# Patient Record
Sex: Female | Born: 1935 | Race: White | Hispanic: No | Smoking: Never smoker
Health system: Southern US, Community
[De-identification: ages and names within clinical notes are randomized; demographics above are authoritative.]

## PROBLEM LIST (undated history)

## (undated) DIAGNOSIS — H269 Unspecified cataract: Secondary | ICD-10-CM

## (undated) DIAGNOSIS — D649 Anemia, unspecified: Secondary | ICD-10-CM

## (undated) DIAGNOSIS — T7840XA Allergy, unspecified, initial encounter: Secondary | ICD-10-CM

## (undated) DIAGNOSIS — M1990 Unspecified osteoarthritis, unspecified site: Secondary | ICD-10-CM

## (undated) DIAGNOSIS — I10 Essential (primary) hypertension: Secondary | ICD-10-CM

## (undated) DIAGNOSIS — R011 Cardiac murmur, unspecified: Secondary | ICD-10-CM

## (undated) DIAGNOSIS — C4490 Unspecified malignant neoplasm of skin, unspecified: Secondary | ICD-10-CM

## (undated) DIAGNOSIS — C801 Malignant (primary) neoplasm, unspecified: Secondary | ICD-10-CM

## (undated) DIAGNOSIS — K219 Gastro-esophageal reflux disease without esophagitis: Secondary | ICD-10-CM

## (undated) HISTORY — PX: EYE SURGERY: SHX253

## (undated) HISTORY — PX: BELPHAROPTOSIS REPAIR: SHX369

## (undated) HISTORY — PX: TONSILLECTOMY: SUR1361

## (undated) HISTORY — DX: Cardiac murmur, unspecified: R01.1

---

## 1999-05-22 ENCOUNTER — Ambulatory Visit (HOSPITAL_COMMUNITY): Admission: RE | Admit: 1999-05-22 | Discharge: 1999-05-22 | Payer: Self-pay | Admitting: Endocrinology

## 2007-10-02 ENCOUNTER — Ambulatory Visit: Payer: Self-pay | Admitting: Gastroenterology

## 2007-10-16 ENCOUNTER — Ambulatory Visit: Payer: Self-pay | Admitting: Gastroenterology

## 2010-10-01 ENCOUNTER — Encounter
Admission: RE | Admit: 2010-10-01 | Discharge: 2010-10-01 | Payer: Self-pay | Source: Home / Self Care | Attending: Endocrinology | Admitting: Endocrinology

## 2011-11-09 DIAGNOSIS — R7301 Impaired fasting glucose: Secondary | ICD-10-CM | POA: Diagnosis not present

## 2011-11-09 DIAGNOSIS — E785 Hyperlipidemia, unspecified: Secondary | ICD-10-CM | POA: Diagnosis not present

## 2011-11-09 DIAGNOSIS — M810 Age-related osteoporosis without current pathological fracture: Secondary | ICD-10-CM | POA: Diagnosis not present

## 2011-11-09 DIAGNOSIS — I10 Essential (primary) hypertension: Secondary | ICD-10-CM | POA: Diagnosis not present

## 2011-11-11 DIAGNOSIS — R7301 Impaired fasting glucose: Secondary | ICD-10-CM | POA: Diagnosis not present

## 2011-11-11 DIAGNOSIS — K589 Irritable bowel syndrome without diarrhea: Secondary | ICD-10-CM | POA: Diagnosis not present

## 2011-11-11 DIAGNOSIS — I10 Essential (primary) hypertension: Secondary | ICD-10-CM | POA: Diagnosis not present

## 2011-11-11 DIAGNOSIS — M810 Age-related osteoporosis without current pathological fracture: Secondary | ICD-10-CM | POA: Diagnosis not present

## 2011-11-11 DIAGNOSIS — E785 Hyperlipidemia, unspecified: Secondary | ICD-10-CM | POA: Diagnosis not present

## 2012-01-07 DIAGNOSIS — Z1231 Encounter for screening mammogram for malignant neoplasm of breast: Secondary | ICD-10-CM | POA: Diagnosis not present

## 2012-03-21 DIAGNOSIS — E785 Hyperlipidemia, unspecified: Secondary | ICD-10-CM | POA: Diagnosis not present

## 2012-03-21 DIAGNOSIS — I10 Essential (primary) hypertension: Secondary | ICD-10-CM | POA: Diagnosis not present

## 2012-03-21 DIAGNOSIS — R7301 Impaired fasting glucose: Secondary | ICD-10-CM | POA: Diagnosis not present

## 2012-03-23 DIAGNOSIS — I10 Essential (primary) hypertension: Secondary | ICD-10-CM | POA: Diagnosis not present

## 2012-03-23 DIAGNOSIS — K589 Irritable bowel syndrome without diarrhea: Secondary | ICD-10-CM | POA: Diagnosis not present

## 2012-03-23 DIAGNOSIS — E785 Hyperlipidemia, unspecified: Secondary | ICD-10-CM | POA: Diagnosis not present

## 2012-03-23 DIAGNOSIS — R7301 Impaired fasting glucose: Secondary | ICD-10-CM | POA: Diagnosis not present

## 2012-03-23 DIAGNOSIS — M810 Age-related osteoporosis without current pathological fracture: Secondary | ICD-10-CM | POA: Diagnosis not present

## 2012-07-24 DIAGNOSIS — R7301 Impaired fasting glucose: Secondary | ICD-10-CM | POA: Diagnosis not present

## 2012-07-24 DIAGNOSIS — I10 Essential (primary) hypertension: Secondary | ICD-10-CM | POA: Diagnosis not present

## 2012-07-24 DIAGNOSIS — M810 Age-related osteoporosis without current pathological fracture: Secondary | ICD-10-CM | POA: Diagnosis not present

## 2012-07-24 DIAGNOSIS — E785 Hyperlipidemia, unspecified: Secondary | ICD-10-CM | POA: Diagnosis not present

## 2012-07-26 DIAGNOSIS — E785 Hyperlipidemia, unspecified: Secondary | ICD-10-CM | POA: Diagnosis not present

## 2012-07-26 DIAGNOSIS — Z23 Encounter for immunization: Secondary | ICD-10-CM | POA: Diagnosis not present

## 2012-07-26 DIAGNOSIS — M810 Age-related osteoporosis without current pathological fracture: Secondary | ICD-10-CM | POA: Diagnosis not present

## 2012-07-26 DIAGNOSIS — K589 Irritable bowel syndrome without diarrhea: Secondary | ICD-10-CM | POA: Diagnosis not present

## 2012-07-26 DIAGNOSIS — I10 Essential (primary) hypertension: Secondary | ICD-10-CM | POA: Diagnosis not present

## 2012-07-26 DIAGNOSIS — R7301 Impaired fasting glucose: Secondary | ICD-10-CM | POA: Diagnosis not present

## 2012-09-08 DIAGNOSIS — L819 Disorder of pigmentation, unspecified: Secondary | ICD-10-CM | POA: Diagnosis not present

## 2012-09-08 DIAGNOSIS — D1801 Hemangioma of skin and subcutaneous tissue: Secondary | ICD-10-CM | POA: Diagnosis not present

## 2012-09-08 DIAGNOSIS — L723 Sebaceous cyst: Secondary | ICD-10-CM | POA: Diagnosis not present

## 2012-09-08 DIAGNOSIS — B351 Tinea unguium: Secondary | ICD-10-CM | POA: Diagnosis not present

## 2012-09-08 DIAGNOSIS — L821 Other seborrheic keratosis: Secondary | ICD-10-CM | POA: Diagnosis not present

## 2012-09-08 DIAGNOSIS — Z85828 Personal history of other malignant neoplasm of skin: Secondary | ICD-10-CM | POA: Diagnosis not present

## 2012-09-28 DIAGNOSIS — H31009 Unspecified chorioretinal scars, unspecified eye: Secondary | ICD-10-CM | POA: Diagnosis not present

## 2012-09-28 DIAGNOSIS — H26499 Other secondary cataract, unspecified eye: Secondary | ICD-10-CM | POA: Diagnosis not present

## 2012-09-28 DIAGNOSIS — Z961 Presence of intraocular lens: Secondary | ICD-10-CM | POA: Diagnosis not present

## 2012-11-27 DIAGNOSIS — I10 Essential (primary) hypertension: Secondary | ICD-10-CM | POA: Diagnosis not present

## 2012-11-27 DIAGNOSIS — R7301 Impaired fasting glucose: Secondary | ICD-10-CM | POA: Diagnosis not present

## 2012-11-27 DIAGNOSIS — E785 Hyperlipidemia, unspecified: Secondary | ICD-10-CM | POA: Diagnosis not present

## 2012-11-27 DIAGNOSIS — M810 Age-related osteoporosis without current pathological fracture: Secondary | ICD-10-CM | POA: Diagnosis not present

## 2012-11-29 DIAGNOSIS — E785 Hyperlipidemia, unspecified: Secondary | ICD-10-CM | POA: Diagnosis not present

## 2012-11-29 DIAGNOSIS — K589 Irritable bowel syndrome without diarrhea: Secondary | ICD-10-CM | POA: Diagnosis not present

## 2012-11-29 DIAGNOSIS — I10 Essential (primary) hypertension: Secondary | ICD-10-CM | POA: Diagnosis not present

## 2012-11-29 DIAGNOSIS — M810 Age-related osteoporosis without current pathological fracture: Secondary | ICD-10-CM | POA: Diagnosis not present

## 2012-11-29 DIAGNOSIS — R7301 Impaired fasting glucose: Secondary | ICD-10-CM | POA: Diagnosis not present

## 2013-01-30 DIAGNOSIS — Z1231 Encounter for screening mammogram for malignant neoplasm of breast: Secondary | ICD-10-CM | POA: Diagnosis not present

## 2013-03-29 DIAGNOSIS — E785 Hyperlipidemia, unspecified: Secondary | ICD-10-CM | POA: Diagnosis not present

## 2013-03-29 DIAGNOSIS — R7301 Impaired fasting glucose: Secondary | ICD-10-CM | POA: Diagnosis not present

## 2013-03-29 DIAGNOSIS — I10 Essential (primary) hypertension: Secondary | ICD-10-CM | POA: Diagnosis not present

## 2013-04-03 DIAGNOSIS — E785 Hyperlipidemia, unspecified: Secondary | ICD-10-CM | POA: Diagnosis not present

## 2013-04-03 DIAGNOSIS — I10 Essential (primary) hypertension: Secondary | ICD-10-CM | POA: Diagnosis not present

## 2013-04-03 DIAGNOSIS — M810 Age-related osteoporosis without current pathological fracture: Secondary | ICD-10-CM | POA: Diagnosis not present

## 2013-04-03 DIAGNOSIS — K589 Irritable bowel syndrome without diarrhea: Secondary | ICD-10-CM | POA: Diagnosis not present

## 2013-04-03 DIAGNOSIS — R7301 Impaired fasting glucose: Secondary | ICD-10-CM | POA: Diagnosis not present

## 2013-08-06 DIAGNOSIS — E785 Hyperlipidemia, unspecified: Secondary | ICD-10-CM | POA: Diagnosis not present

## 2013-08-06 DIAGNOSIS — I10 Essential (primary) hypertension: Secondary | ICD-10-CM | POA: Diagnosis not present

## 2013-08-06 DIAGNOSIS — M810 Age-related osteoporosis without current pathological fracture: Secondary | ICD-10-CM | POA: Diagnosis not present

## 2013-08-06 DIAGNOSIS — R7301 Impaired fasting glucose: Secondary | ICD-10-CM | POA: Diagnosis not present

## 2013-08-09 DIAGNOSIS — E785 Hyperlipidemia, unspecified: Secondary | ICD-10-CM | POA: Diagnosis not present

## 2013-08-09 DIAGNOSIS — R7301 Impaired fasting glucose: Secondary | ICD-10-CM | POA: Diagnosis not present

## 2013-08-09 DIAGNOSIS — M810 Age-related osteoporosis without current pathological fracture: Secondary | ICD-10-CM | POA: Diagnosis not present

## 2013-08-09 DIAGNOSIS — Z23 Encounter for immunization: Secondary | ICD-10-CM | POA: Diagnosis not present

## 2013-08-09 DIAGNOSIS — I10 Essential (primary) hypertension: Secondary | ICD-10-CM | POA: Diagnosis not present

## 2013-08-09 DIAGNOSIS — K589 Irritable bowel syndrome without diarrhea: Secondary | ICD-10-CM | POA: Diagnosis not present

## 2013-12-07 DIAGNOSIS — L678 Other hair color and hair shaft abnormalities: Secondary | ICD-10-CM | POA: Diagnosis not present

## 2013-12-07 DIAGNOSIS — L738 Other specified follicular disorders: Secondary | ICD-10-CM | POA: Diagnosis not present

## 2013-12-07 DIAGNOSIS — D1801 Hemangioma of skin and subcutaneous tissue: Secondary | ICD-10-CM | POA: Diagnosis not present

## 2013-12-07 DIAGNOSIS — L821 Other seborrheic keratosis: Secondary | ICD-10-CM | POA: Diagnosis not present

## 2013-12-11 DIAGNOSIS — I10 Essential (primary) hypertension: Secondary | ICD-10-CM | POA: Diagnosis not present

## 2013-12-11 DIAGNOSIS — E785 Hyperlipidemia, unspecified: Secondary | ICD-10-CM | POA: Diagnosis not present

## 2013-12-11 DIAGNOSIS — R7301 Impaired fasting glucose: Secondary | ICD-10-CM | POA: Diagnosis not present

## 2013-12-13 DIAGNOSIS — E785 Hyperlipidemia, unspecified: Secondary | ICD-10-CM | POA: Diagnosis not present

## 2013-12-13 DIAGNOSIS — M810 Age-related osteoporosis without current pathological fracture: Secondary | ICD-10-CM | POA: Diagnosis not present

## 2013-12-13 DIAGNOSIS — I10 Essential (primary) hypertension: Secondary | ICD-10-CM | POA: Diagnosis not present

## 2013-12-13 DIAGNOSIS — K589 Irritable bowel syndrome without diarrhea: Secondary | ICD-10-CM | POA: Diagnosis not present

## 2013-12-13 DIAGNOSIS — R7301 Impaired fasting glucose: Secondary | ICD-10-CM | POA: Diagnosis not present

## 2014-05-03 DIAGNOSIS — Z1231 Encounter for screening mammogram for malignant neoplasm of breast: Secondary | ICD-10-CM | POA: Diagnosis not present

## 2014-05-03 DIAGNOSIS — Z803 Family history of malignant neoplasm of breast: Secondary | ICD-10-CM | POA: Diagnosis not present

## 2014-05-13 DIAGNOSIS — I10 Essential (primary) hypertension: Secondary | ICD-10-CM | POA: Diagnosis not present

## 2014-05-13 DIAGNOSIS — R7301 Impaired fasting glucose: Secondary | ICD-10-CM | POA: Diagnosis not present

## 2014-05-13 DIAGNOSIS — E785 Hyperlipidemia, unspecified: Secondary | ICD-10-CM | POA: Diagnosis not present

## 2014-05-15 DIAGNOSIS — K589 Irritable bowel syndrome without diarrhea: Secondary | ICD-10-CM | POA: Diagnosis not present

## 2014-05-15 DIAGNOSIS — R7301 Impaired fasting glucose: Secondary | ICD-10-CM | POA: Diagnosis not present

## 2014-05-15 DIAGNOSIS — M810 Age-related osteoporosis without current pathological fracture: Secondary | ICD-10-CM | POA: Diagnosis not present

## 2014-05-15 DIAGNOSIS — I10 Essential (primary) hypertension: Secondary | ICD-10-CM | POA: Diagnosis not present

## 2014-05-15 DIAGNOSIS — E785 Hyperlipidemia, unspecified: Secondary | ICD-10-CM | POA: Diagnosis not present

## 2014-06-10 ENCOUNTER — Encounter: Payer: Self-pay | Admitting: Gastroenterology

## 2014-07-02 DIAGNOSIS — Z23 Encounter for immunization: Secondary | ICD-10-CM | POA: Diagnosis not present

## 2014-10-02 DIAGNOSIS — Z85828 Personal history of other malignant neoplasm of skin: Secondary | ICD-10-CM | POA: Diagnosis not present

## 2014-10-02 DIAGNOSIS — L821 Other seborrheic keratosis: Secondary | ICD-10-CM | POA: Diagnosis not present

## 2014-10-02 DIAGNOSIS — L570 Actinic keratosis: Secondary | ICD-10-CM | POA: Diagnosis not present

## 2014-10-02 DIAGNOSIS — D2271 Melanocytic nevi of right lower limb, including hip: Secondary | ICD-10-CM | POA: Diagnosis not present

## 2014-10-02 DIAGNOSIS — L814 Other melanin hyperpigmentation: Secondary | ICD-10-CM | POA: Diagnosis not present

## 2014-10-08 DIAGNOSIS — R7301 Impaired fasting glucose: Secondary | ICD-10-CM | POA: Diagnosis not present

## 2014-10-08 DIAGNOSIS — E782 Mixed hyperlipidemia: Secondary | ICD-10-CM | POA: Diagnosis not present

## 2014-10-08 DIAGNOSIS — K589 Irritable bowel syndrome without diarrhea: Secondary | ICD-10-CM | POA: Diagnosis not present

## 2014-10-08 DIAGNOSIS — M810 Age-related osteoporosis without current pathological fracture: Secondary | ICD-10-CM | POA: Diagnosis not present

## 2014-10-08 DIAGNOSIS — I10 Essential (primary) hypertension: Secondary | ICD-10-CM | POA: Diagnosis not present

## 2014-11-05 DIAGNOSIS — H43393 Other vitreous opacities, bilateral: Secondary | ICD-10-CM | POA: Diagnosis not present

## 2014-11-05 DIAGNOSIS — Z961 Presence of intraocular lens: Secondary | ICD-10-CM | POA: Diagnosis not present

## 2014-11-05 DIAGNOSIS — H26491 Other secondary cataract, right eye: Secondary | ICD-10-CM | POA: Diagnosis not present

## 2015-02-04 DIAGNOSIS — I10 Essential (primary) hypertension: Secondary | ICD-10-CM | POA: Diagnosis not present

## 2015-02-04 DIAGNOSIS — R7301 Impaired fasting glucose: Secondary | ICD-10-CM | POA: Diagnosis not present

## 2015-02-04 DIAGNOSIS — E782 Mixed hyperlipidemia: Secondary | ICD-10-CM | POA: Diagnosis not present

## 2015-02-06 DIAGNOSIS — I10 Essential (primary) hypertension: Secondary | ICD-10-CM | POA: Diagnosis not present

## 2015-02-06 DIAGNOSIS — M810 Age-related osteoporosis without current pathological fracture: Secondary | ICD-10-CM | POA: Diagnosis not present

## 2015-02-06 DIAGNOSIS — E782 Mixed hyperlipidemia: Secondary | ICD-10-CM | POA: Diagnosis not present

## 2015-02-06 DIAGNOSIS — K589 Irritable bowel syndrome without diarrhea: Secondary | ICD-10-CM | POA: Diagnosis not present

## 2015-02-06 DIAGNOSIS — R7301 Impaired fasting glucose: Secondary | ICD-10-CM | POA: Diagnosis not present

## 2015-06-04 DIAGNOSIS — R7301 Impaired fasting glucose: Secondary | ICD-10-CM | POA: Diagnosis not present

## 2015-06-04 DIAGNOSIS — E782 Mixed hyperlipidemia: Secondary | ICD-10-CM | POA: Diagnosis not present

## 2015-06-04 DIAGNOSIS — I10 Essential (primary) hypertension: Secondary | ICD-10-CM | POA: Diagnosis not present

## 2015-06-06 DIAGNOSIS — R7301 Impaired fasting glucose: Secondary | ICD-10-CM | POA: Diagnosis not present

## 2015-06-06 DIAGNOSIS — Z23 Encounter for immunization: Secondary | ICD-10-CM | POA: Diagnosis not present

## 2015-06-06 DIAGNOSIS — E782 Mixed hyperlipidemia: Secondary | ICD-10-CM | POA: Diagnosis not present

## 2015-06-06 DIAGNOSIS — K589 Irritable bowel syndrome without diarrhea: Secondary | ICD-10-CM | POA: Diagnosis not present

## 2015-06-06 DIAGNOSIS — I10 Essential (primary) hypertension: Secondary | ICD-10-CM | POA: Diagnosis not present

## 2015-06-06 DIAGNOSIS — M810 Age-related osteoporosis without current pathological fracture: Secondary | ICD-10-CM | POA: Diagnosis not present

## 2015-09-26 DIAGNOSIS — M8589 Other specified disorders of bone density and structure, multiple sites: Secondary | ICD-10-CM | POA: Diagnosis not present

## 2015-09-26 DIAGNOSIS — Z803 Family history of malignant neoplasm of breast: Secondary | ICD-10-CM | POA: Diagnosis not present

## 2015-09-26 DIAGNOSIS — Z1231 Encounter for screening mammogram for malignant neoplasm of breast: Secondary | ICD-10-CM | POA: Diagnosis not present

## 2015-10-01 DIAGNOSIS — D1801 Hemangioma of skin and subcutaneous tissue: Secondary | ICD-10-CM | POA: Diagnosis not present

## 2015-10-01 DIAGNOSIS — L821 Other seborrheic keratosis: Secondary | ICD-10-CM | POA: Diagnosis not present

## 2015-10-01 DIAGNOSIS — L603 Nail dystrophy: Secondary | ICD-10-CM | POA: Diagnosis not present

## 2015-10-01 DIAGNOSIS — L720 Epidermal cyst: Secondary | ICD-10-CM | POA: Diagnosis not present

## 2015-10-01 DIAGNOSIS — L814 Other melanin hyperpigmentation: Secondary | ICD-10-CM | POA: Diagnosis not present

## 2015-10-01 DIAGNOSIS — Z85828 Personal history of other malignant neoplasm of skin: Secondary | ICD-10-CM | POA: Diagnosis not present

## 2015-10-01 DIAGNOSIS — D2271 Melanocytic nevi of right lower limb, including hip: Secondary | ICD-10-CM | POA: Diagnosis not present

## 2015-10-14 DIAGNOSIS — E782 Mixed hyperlipidemia: Secondary | ICD-10-CM | POA: Diagnosis not present

## 2015-10-14 DIAGNOSIS — R7301 Impaired fasting glucose: Secondary | ICD-10-CM | POA: Diagnosis not present

## 2015-10-14 DIAGNOSIS — I10 Essential (primary) hypertension: Secondary | ICD-10-CM | POA: Diagnosis not present

## 2015-10-20 DIAGNOSIS — K589 Irritable bowel syndrome without diarrhea: Secondary | ICD-10-CM | POA: Diagnosis not present

## 2015-10-20 DIAGNOSIS — E782 Mixed hyperlipidemia: Secondary | ICD-10-CM | POA: Diagnosis not present

## 2015-10-20 DIAGNOSIS — M810 Age-related osteoporosis without current pathological fracture: Secondary | ICD-10-CM | POA: Diagnosis not present

## 2015-10-20 DIAGNOSIS — I10 Essential (primary) hypertension: Secondary | ICD-10-CM | POA: Diagnosis not present

## 2015-10-20 DIAGNOSIS — R7301 Impaired fasting glucose: Secondary | ICD-10-CM | POA: Diagnosis not present

## 2016-02-12 DIAGNOSIS — E782 Mixed hyperlipidemia: Secondary | ICD-10-CM | POA: Diagnosis not present

## 2016-02-12 DIAGNOSIS — R7301 Impaired fasting glucose: Secondary | ICD-10-CM | POA: Diagnosis not present

## 2016-02-12 DIAGNOSIS — I10 Essential (primary) hypertension: Secondary | ICD-10-CM | POA: Diagnosis not present

## 2016-02-17 DIAGNOSIS — K589 Irritable bowel syndrome without diarrhea: Secondary | ICD-10-CM | POA: Diagnosis not present

## 2016-02-17 DIAGNOSIS — M810 Age-related osteoporosis without current pathological fracture: Secondary | ICD-10-CM | POA: Diagnosis not present

## 2016-02-17 DIAGNOSIS — E782 Mixed hyperlipidemia: Secondary | ICD-10-CM | POA: Diagnosis not present

## 2016-02-17 DIAGNOSIS — I10 Essential (primary) hypertension: Secondary | ICD-10-CM | POA: Diagnosis not present

## 2016-02-17 DIAGNOSIS — R7301 Impaired fasting glucose: Secondary | ICD-10-CM | POA: Diagnosis not present

## 2016-06-16 DIAGNOSIS — I10 Essential (primary) hypertension: Secondary | ICD-10-CM | POA: Diagnosis not present

## 2016-06-16 DIAGNOSIS — E782 Mixed hyperlipidemia: Secondary | ICD-10-CM | POA: Diagnosis not present

## 2016-06-16 DIAGNOSIS — R7301 Impaired fasting glucose: Secondary | ICD-10-CM | POA: Diagnosis not present

## 2016-06-18 DIAGNOSIS — R7301 Impaired fasting glucose: Secondary | ICD-10-CM | POA: Diagnosis not present

## 2016-06-18 DIAGNOSIS — E782 Mixed hyperlipidemia: Secondary | ICD-10-CM | POA: Diagnosis not present

## 2016-06-18 DIAGNOSIS — Z23 Encounter for immunization: Secondary | ICD-10-CM | POA: Diagnosis not present

## 2016-06-18 DIAGNOSIS — K589 Irritable bowel syndrome without diarrhea: Secondary | ICD-10-CM | POA: Diagnosis not present

## 2016-06-18 DIAGNOSIS — M810 Age-related osteoporosis without current pathological fracture: Secondary | ICD-10-CM | POA: Diagnosis not present

## 2016-06-18 DIAGNOSIS — I10 Essential (primary) hypertension: Secondary | ICD-10-CM | POA: Diagnosis not present

## 2016-09-28 DIAGNOSIS — Z1231 Encounter for screening mammogram for malignant neoplasm of breast: Secondary | ICD-10-CM | POA: Diagnosis not present

## 2016-10-19 DIAGNOSIS — I10 Essential (primary) hypertension: Secondary | ICD-10-CM | POA: Diagnosis not present

## 2016-10-19 DIAGNOSIS — R7301 Impaired fasting glucose: Secondary | ICD-10-CM | POA: Diagnosis not present

## 2016-10-19 DIAGNOSIS — E782 Mixed hyperlipidemia: Secondary | ICD-10-CM | POA: Diagnosis not present

## 2016-10-21 DIAGNOSIS — M810 Age-related osteoporosis without current pathological fracture: Secondary | ICD-10-CM | POA: Diagnosis not present

## 2016-10-21 DIAGNOSIS — E782 Mixed hyperlipidemia: Secondary | ICD-10-CM | POA: Diagnosis not present

## 2016-10-21 DIAGNOSIS — J3089 Other allergic rhinitis: Secondary | ICD-10-CM | POA: Diagnosis not present

## 2016-10-21 DIAGNOSIS — K589 Irritable bowel syndrome without diarrhea: Secondary | ICD-10-CM | POA: Diagnosis not present

## 2016-10-21 DIAGNOSIS — R7301 Impaired fasting glucose: Secondary | ICD-10-CM | POA: Diagnosis not present

## 2016-10-21 DIAGNOSIS — R202 Paresthesia of skin: Secondary | ICD-10-CM | POA: Diagnosis not present

## 2016-10-21 DIAGNOSIS — I10 Essential (primary) hypertension: Secondary | ICD-10-CM | POA: Diagnosis not present

## 2016-10-21 DIAGNOSIS — K219 Gastro-esophageal reflux disease without esophagitis: Secondary | ICD-10-CM | POA: Diagnosis not present

## 2016-10-26 DIAGNOSIS — L720 Epidermal cyst: Secondary | ICD-10-CM | POA: Diagnosis not present

## 2016-10-26 DIAGNOSIS — L821 Other seborrheic keratosis: Secondary | ICD-10-CM | POA: Diagnosis not present

## 2016-10-26 DIAGNOSIS — L918 Other hypertrophic disorders of the skin: Secondary | ICD-10-CM | POA: Diagnosis not present

## 2016-10-26 DIAGNOSIS — L700 Acne vulgaris: Secondary | ICD-10-CM | POA: Diagnosis not present

## 2016-10-26 DIAGNOSIS — L814 Other melanin hyperpigmentation: Secondary | ICD-10-CM | POA: Diagnosis not present

## 2016-10-26 DIAGNOSIS — L2089 Other atopic dermatitis: Secondary | ICD-10-CM | POA: Diagnosis not present

## 2016-12-31 DIAGNOSIS — H26491 Other secondary cataract, right eye: Secondary | ICD-10-CM | POA: Diagnosis not present

## 2016-12-31 DIAGNOSIS — D3131 Benign neoplasm of right choroid: Secondary | ICD-10-CM | POA: Diagnosis not present

## 2016-12-31 DIAGNOSIS — H43393 Other vitreous opacities, bilateral: Secondary | ICD-10-CM | POA: Diagnosis not present

## 2016-12-31 DIAGNOSIS — Z961 Presence of intraocular lens: Secondary | ICD-10-CM | POA: Diagnosis not present

## 2017-02-23 DIAGNOSIS — R7301 Impaired fasting glucose: Secondary | ICD-10-CM | POA: Diagnosis not present

## 2017-02-23 DIAGNOSIS — I10 Essential (primary) hypertension: Secondary | ICD-10-CM | POA: Diagnosis not present

## 2017-02-23 DIAGNOSIS — E782 Mixed hyperlipidemia: Secondary | ICD-10-CM | POA: Diagnosis not present

## 2017-02-28 DIAGNOSIS — K219 Gastro-esophageal reflux disease without esophagitis: Secondary | ICD-10-CM | POA: Diagnosis not present

## 2017-02-28 DIAGNOSIS — J309 Allergic rhinitis, unspecified: Secondary | ICD-10-CM | POA: Diagnosis not present

## 2017-02-28 DIAGNOSIS — I10 Essential (primary) hypertension: Secondary | ICD-10-CM | POA: Diagnosis not present

## 2017-02-28 DIAGNOSIS — E782 Mixed hyperlipidemia: Secondary | ICD-10-CM | POA: Diagnosis not present

## 2017-02-28 DIAGNOSIS — R7301 Impaired fasting glucose: Secondary | ICD-10-CM | POA: Diagnosis not present

## 2017-02-28 DIAGNOSIS — K589 Irritable bowel syndrome without diarrhea: Secondary | ICD-10-CM | POA: Diagnosis not present

## 2017-02-28 DIAGNOSIS — M810 Age-related osteoporosis without current pathological fracture: Secondary | ICD-10-CM | POA: Diagnosis not present

## 2017-05-23 DIAGNOSIS — Z23 Encounter for immunization: Secondary | ICD-10-CM | POA: Diagnosis not present

## 2017-06-30 DIAGNOSIS — R7301 Impaired fasting glucose: Secondary | ICD-10-CM | POA: Diagnosis not present

## 2017-06-30 DIAGNOSIS — I10 Essential (primary) hypertension: Secondary | ICD-10-CM | POA: Diagnosis not present

## 2017-06-30 DIAGNOSIS — E782 Mixed hyperlipidemia: Secondary | ICD-10-CM | POA: Diagnosis not present

## 2017-07-07 DIAGNOSIS — Z23 Encounter for immunization: Secondary | ICD-10-CM | POA: Diagnosis not present

## 2017-07-07 DIAGNOSIS — M810 Age-related osteoporosis without current pathological fracture: Secondary | ICD-10-CM | POA: Diagnosis not present

## 2017-07-07 DIAGNOSIS — K589 Irritable bowel syndrome without diarrhea: Secondary | ICD-10-CM | POA: Diagnosis not present

## 2017-07-07 DIAGNOSIS — K219 Gastro-esophageal reflux disease without esophagitis: Secondary | ICD-10-CM | POA: Diagnosis not present

## 2017-07-07 DIAGNOSIS — J3089 Other allergic rhinitis: Secondary | ICD-10-CM | POA: Diagnosis not present

## 2017-07-07 DIAGNOSIS — R7301 Impaired fasting glucose: Secondary | ICD-10-CM | POA: Diagnosis not present

## 2017-07-07 DIAGNOSIS — E782 Mixed hyperlipidemia: Secondary | ICD-10-CM | POA: Diagnosis not present

## 2017-07-07 DIAGNOSIS — I10 Essential (primary) hypertension: Secondary | ICD-10-CM | POA: Diagnosis not present

## 2017-10-13 ENCOUNTER — Encounter: Payer: Self-pay | Admitting: Gastroenterology

## 2019-08-02 ENCOUNTER — Emergency Department (HOSPITAL_COMMUNITY): Payer: Medicare Other

## 2019-08-02 ENCOUNTER — Encounter (HOSPITAL_COMMUNITY): Payer: Self-pay

## 2019-08-02 ENCOUNTER — Emergency Department (HOSPITAL_COMMUNITY)
Admission: EM | Admit: 2019-08-02 | Discharge: 2019-08-02 | Disposition: A | Discharge status: Home / Self Care | Payer: Medicare Other | Attending: Emergency Medicine | Admitting: Emergency Medicine

## 2019-08-02 ENCOUNTER — Other Ambulatory Visit: Payer: Self-pay

## 2019-08-02 DIAGNOSIS — I10 Essential (primary) hypertension: Secondary | ICD-10-CM | POA: Diagnosis not present

## 2019-08-02 DIAGNOSIS — K209 Esophagitis, unspecified without bleeding: Secondary | ICD-10-CM | POA: Insufficient documentation

## 2019-08-02 DIAGNOSIS — R1013 Epigastric pain: Secondary | ICD-10-CM | POA: Diagnosis present

## 2019-08-02 DIAGNOSIS — Z79899 Other long term (current) drug therapy: Secondary | ICD-10-CM | POA: Insufficient documentation

## 2019-08-02 DIAGNOSIS — K449 Diaphragmatic hernia without obstruction or gangrene: Secondary | ICD-10-CM | POA: Diagnosis not present

## 2019-08-02 HISTORY — DX: Essential (primary) hypertension: I10

## 2019-08-02 HISTORY — DX: Gastro-esophageal reflux disease without esophagitis: K21.9

## 2019-08-02 LAB — CBC WITH DIFFERENTIAL/PLATELET
Abs Immature Granulocytes: 0.06 10*3/uL (ref 0.00–0.07)
Basophils Absolute: 0 10*3/uL (ref 0.0–0.1)
Basophils Relative: 0 %
Eosinophils Absolute: 0.1 10*3/uL (ref 0.0–0.5)
Eosinophils Relative: 1 %
HCT: 37.2 % (ref 36.0–46.0)
Hemoglobin: 12.3 g/dL (ref 12.0–15.0)
Immature Granulocytes: 0 %
Lymphocytes Relative: 9 %
Lymphs Abs: 1.4 10*3/uL (ref 0.7–4.0)
MCH: 30.4 pg (ref 26.0–34.0)
MCHC: 33.1 g/dL (ref 30.0–36.0)
MCV: 92.1 fL (ref 80.0–100.0)
Monocytes Absolute: 0.7 10*3/uL (ref 0.1–1.0)
Monocytes Relative: 5 %
Neutro Abs: 13 10*3/uL — ABNORMAL HIGH (ref 1.7–7.7)
Neutrophils Relative %: 85 %
Platelets: 221 10*3/uL (ref 150–400)
RBC: 4.04 MIL/uL (ref 3.87–5.11)
RDW: 11.7 % (ref 11.5–15.5)
WBC: 15.3 10*3/uL — ABNORMAL HIGH (ref 4.0–10.5)
nRBC: 0 % (ref 0.0–0.2)

## 2019-08-02 LAB — COMPREHENSIVE METABOLIC PANEL
ALT: 19 U/L (ref 0–44)
AST: 22 U/L (ref 15–41)
Albumin: 4.1 g/dL (ref 3.5–5.0)
Alkaline Phosphatase: 79 U/L (ref 38–126)
Anion gap: 7 (ref 5–15)
BUN: 23 mg/dL (ref 8–23)
CO2: 28 mmol/L (ref 22–32)
Calcium: 8.9 mg/dL (ref 8.9–10.3)
Chloride: 102 mmol/L (ref 98–111)
Creatinine, Ser: 0.86 mg/dL (ref 0.44–1.00)
GFR calc Af Amer: >60 mL/min (ref >60)
GFR calc non Af Amer: >60 mL/min (ref >60)
Glucose, Bld: 136 mg/dL — ABNORMAL HIGH (ref 70–99)
Potassium: 4.3 mmol/L (ref 3.5–5.1)
Sodium: 137 mmol/L (ref 135–145)
Total Bilirubin: 0.6 mg/dL (ref 0.3–1.2)
Total Protein: 7.1 g/dL (ref 6.5–8.1)

## 2019-08-02 LAB — TROPONIN I (HIGH SENSITIVITY)
Troponin I (High Sensitivity): 12 ng/L (ref <18)
Troponin I (High Sensitivity): 11 ng/L (ref <18)

## 2019-08-02 LAB — LIPASE, BLOOD: Lipase: 32 U/L (ref 11–51)

## 2019-08-02 MED ORDER — PANTOPRAZOLE SODIUM 40 MG PO TBEC
40.0000 mg | DELAYED_RELEASE_TABLET | Freq: Once | ORAL | Status: AC
  Administered 2019-08-02: 02:00:00 40 mg via ORAL
  Filled 2019-08-02: qty 1

## 2019-08-02 MED ORDER — FAMOTIDINE 20 MG PO TABS: 20.0000 mg | ORAL_TABLET | Freq: Two times a day (BID) | ORAL | 0 refills | Status: DC

## 2019-08-02 MED ORDER — LIDOCAINE VISCOUS HCL 2 % MT SOLN
15.0000 mL | Freq: Once | OROMUCOSAL | Status: AC
  Administered 2019-08-02: 15 mL via OROMUCOSAL
  Filled 2019-08-02: qty 15

## 2019-08-02 MED ORDER — IOHEXOL 300 MG/ML  SOLN
50.0000 mL | Freq: Once | INTRAMUSCULAR | Status: AC | PRN
  Administered 2019-08-02: 50 mL via INTRAVENOUS

## 2019-08-02 MED ORDER — SUCRALFATE 1 GM/10ML PO SUSP: 1.0000 g | Freq: Three times a day (TID) | ORAL | 0 refills | Status: DC

## 2019-08-02 MED ORDER — IOHEXOL 300 MG/ML  SOLN
100.0000 mL | Freq: Once | INTRAMUSCULAR | Status: AC | PRN
  Administered 2019-08-02: 100 mL via INTRAVENOUS

## 2019-08-02 MED ORDER — FAMOTIDINE 20 MG PO TABS
20.0000 mg | ORAL_TABLET | Freq: Once | ORAL | Status: AC
Start: 2019-08-02 — End: 2019-08-02
  Administered 2019-08-02: 20 mg via ORAL
  Filled 2019-08-02: qty 1

## 2019-08-02 MED ORDER — PANTOPRAZOLE SODIUM 20 MG PO TBEC: 20.0000 mg | DELAYED_RELEASE_TABLET | Freq: Every day | ORAL | 0 refills | Status: DC

## 2019-08-02 NOTE — ED Triage Notes (Signed)
Pt reports epigastric pain, sudden onset while in restaurant, broke out in cold sweat, pain decreased some after taking Mylanta. pt has "pain with deep breathing, but not any difficulty breathing". Pt says she does have acid reflux/indegestion, pt says she can swallow but "feels like something is stuck"

## 2019-08-02 NOTE — Discharge Instructions (Signed)
There was an incidental finding of a pulmonary nodule on your CT scan.  You are low risk based on history and thus nothing needs to be done immediately however in 6 months you should get a another CT scan without contrast to evaluate for changes in further management by primary doctor.

## 2019-08-02 NOTE — ED Notes (Signed)
Lab in room.

## 2019-08-02 NOTE — ED Notes (Signed)
Pt ready for transport- Fritz Pickerel in [place] aware.

## 2019-08-02 NOTE — ED Notes (Signed)
edp in room  

## 2019-08-02 NOTE — ED Notes (Signed)
Pt reports increased pain with first swallow of water given-then pain decreases, but increases each time pt swallows. Dr Dayna Barker aware.

## 2019-08-02 NOTE — ED Provider Notes (Signed)
Emergency Department Provider Note   I have reviewed the triage vital signs and the nursing notes.   HISTORY  Chief Complaint Abdominal Pain   HPI Kayla [name] is a 85 y.o. female who presents to the emergency department tonight with epigastric pain.  Patient states that she was eating when she finished eating shortly afterwards the patient started have some severe epigastric pain.  She tried taking some Maalox which did not seem to help and in fact made a bit worse.  She stated she choked a little bit when she took it.  She did not vomit.  Erythema she tried to take since then has exacerbated the pain some but she has been tolerating her secretions and tolerating it without vomiting.  She is never any that this before.  In between episodes its not as bad.  No history of smoking, alcohol and very infrequent anti-inflammatory use.   No other associated or modifying symptoms.    Past Medical History:  Diagnosis Date   GERD (gastroesophageal reflux disease)    Hypertension     There are no active problems to display for this patient.   History reviewed. No pertinent surgical history.  Current Outpatient Rx   Order #: [id number] Class: Normal   Order #: [id number] Class: Normal   Order #: [id number] Class: Normal    Allergies Patient has no known allergies.  No family history on file.  Social History Social History   Tobacco Use   Smoking status: Never Smoker   Smokeless tobacco: Never Used  Substance Use Topics   Alcohol use: Never    Frequency: Never   Drug use: Never    Review of Systems  All other systems negative except as documented in the HPI. All pertinent positives and negatives as reviewed in the HPI. ____________________________________________   PHYSICAL EXAM:  VITAL SIGNS: ED Triage Vitals  Enc Vitals Group     BP 08/02/19 0146 (!) 159/65     Pulse Rate 08/02/19 0146 86     Resp 08/02/19 0146 19     Temp --      Temp src --    SpO2 08/02/19 0146 96 %     Weight 08/02/19 0149 179 lb (81.2 kg)     Height 08/02/19 0149 5\' 8"  (1.727 m)    Constitutional: Alert and oriented. Well appearing and in no acute distress. Eyes: Conjunctivae are normal. PERRL. EOMI. Head: Atraumatic. Nose: No congestion/rhinnorhea. Mouth/Throat: Mucous membranes are moist.  Oropharynx non-erythematous. Neck: No stridor.  No meningeal signs.   Cardiovascular: Normal rate, regular rhythm. Good peripheral circulation. Grossly normal heart sounds.   Respiratory: Normal respiratory effort.  No retractions. Lungs CTAB. Gastrointestinal: Soft and nontender. No distention.  Musculoskeletal: No lower extremity tenderness nor edema. No gross deformities of extremities. Neurologic:  Normal speech and language. No gross focal neurologic deficits are appreciated.  Skin:  Skin is warm, dry and intact. No rash noted.   ____________________________________________   LABS (all labs ordered are listed, but only abnormal results are displayed)  Labs Reviewed  CBC WITH DIFFERENTIAL/PLATELET - Abnormal; Notable for the following components:      Result Value   WBC 15.3 (*)    Neutro Abs 13.0 (*)    All other components within normal limits  COMPREHENSIVE METABOLIC PANEL - Abnormal; Notable for the following components:   Glucose, Bld 136 (*)    All other components within normal limits  LIPASE, BLOOD  TROPONIN I (HIGH SENSITIVITY)  TROPONIN I (HIGH  SENSITIVITY)   ____________________________________________  ADIOLOGY  Ct Chest W Contrast  Result Date: 08/02/2019 CLINICAL DATA:  Chest pain or shortness of breath. Pleurisy or effusion suspected EXAM: CT CHEST WITH CONTRAST TECHNIQUE: Multidetector CT imaging of the chest was performed during intravenous contrast administration. CONTRAST:  63mL OMNIPAQUE IOHEXOL 300 MG/ML  SOLN COMPARISON:  Abdominal CT from earlier the same day FINDINGS: Cardiovascular: Normal heart size. No pericardial effusion.  No acute vascular finding. Aortic atherosclerotic calcification. Mediastinum/Nodes: Small to moderate sliding hiatal hernia. The esophagus appears diffusely thickened. Oral contrast was swallowed and resides in the herniated stomach. No pneumomediastinum, significant pleural effusion, or collection. None of the contrast was extravasated. Subcentimeter nodule in the right thyroid, incidental and non worrisome. Lungs/Pleura: There is no edema, consolidation, effusion, or pneumothorax. Subpleural scar-like opacity in the upper lungs, greater on the right. There is a sub/part solid nodule in the medial right upper lobe measuring 8 mm average diameter, see 4: 33 Upper Abdomen: Negative Musculoskeletal: No acute or aggressive finding.  T7 hemangioma IMPRESSION: 1. Hiatal hernia and diffuse esophageal thickening suggesting esophagitis. No findings of rupture. 2. Part solid 8 mm nodule in the right upper lobe. Depending on comorbidities, follow-up non-contrast CT recommended at 3-6 months to confirm persistence. If unchanged, and solid component remains <6 mm, annual CT is recommended until 5 years of stability has been established. If persistent these nodules should be considered highly suspicious if the solid component of the nodule is 6 mm or greater in size and enlarging. This recommendation follows the consensus statement: Guidelines for Management of Incidental Pulmonary Nodules Detected on CT Images: From the Fleischner Society 2017; Radiology 2017; 284:228-243. 3.  Aortic Atherosclerosis (ICD10-I70.0). Electronically Signed   By: Monte Fantasia M.D.   On: 08/02/2019 05:40   Ct Abdomen Pelvis W Contrast  Result Date: 08/02/2019 CLINICAL DATA:  Dysphagia. Epigastric pain EXAM: CT ABDOMEN AND PELVIS WITH CONTRAST TECHNIQUE: Multidetector CT imaging of the abdomen and pelvis was performed using the standard protocol following bolus administration of intravenous contrast. CONTRAST:  130mL OMNIPAQUE IOHEXOL 300 MG/ML   SOLN COMPARISON:  None. FINDINGS: Lower chest: The lung bases are clear. The heart size is normal. Hepatobiliary: The liver is normal. Normal gallbladder.There is no biliary ductal dilation. Pancreas: Normal contours without ductal dilatation. No peripancreatic fluid collection. Spleen: No splenic laceration or hematoma. Adrenals/Urinary Tract: --Adrenal glands: No adrenal hemorrhage. --Right kidney/ureter: No hydronephrosis or perinephric hematoma. --Left kidney/ureter: No hydronephrosis or perinephric hematoma. --Urinary bladder: Unremarkable. Stomach/Bowel: --Stomach/Duodenum: There is a moderate-sized hiatal hernia with wall thickening of the partially visualized distal esophagus. There is a small amount of free fluid adjacent to the distal esophagus. There are no pockets of free air. --Small bowel: No dilatation or inflammation. --Colon: No focal abnormality. --Appendix: Normal. Vascular/Lymphatic: Atherosclerotic calcification is present within the non-aneurysmal abdominal aorta, without hemodynamically significant stenosis. --No retroperitoneal lymphadenopathy. --No mesenteric lymphadenopathy. --No pelvic or inguinal lymphadenopathy. Reproductive: There is a large cystic pelvic mass measuring 14 by 12 by 12 cm. This mass is favored to be root arising from the left ovary. A thin septation is noted. Other: There is a trace amount of free fluid in the patient's pelvis. The abdominal wall is normal. Musculoskeletal. No acute displaced fractures. IMPRESSION: 1. Moderate hiatal hernia with associated wall thickening of the distal esophagus and a small amount of adjacent free fluid. Findings may be secondary to esophagitis, however an esophageal perforation is not entirely excluded. A contrast enhanced CT of the chest may be  useful for further evaluation. 2. Large cystic pelvic mass measuring up to 14 cm. This is favored to be root arising from the left ovary. Follow-up with a nonemergent outpatient ultrasound is  recommended. Aortic Atherosclerosis (ICD10-I70.0). Electronically Signed   By: Constance Holster M.D.   On: 08/02/2019 04:41    ____________________________________________   PROCEDURES  Procedure(s) performed:   Procedures   ____________________________________________   INITIAL IMPRESSION / ASSESSMENT AND PLAN / ED COURSE  Abdominal CT with questionable esophageal perforation so a chest CT done and her results were ultimately consistent with esophagitis. Improved significantly with meds here. Will start H2/PPI at home with carafate and suggested GI followup. She prefers to set this up through her PCP.  Discussed pulmonary nodule with her and she will discuss with PCP.    Pertinent labs & imaging results that were available during my care of the patient were reviewed by me and considered in my medical decision making (see chart for details).  A medical screening exam was performed and I feel the patient has had an appropriate workup for their chief complaint at this time and likelihood of emergent condition existing is low. They have been counseled on decision, discharge, follow up and which symptoms necessitate immediate return to the emergency department. They or their family verbally stated understanding and agreement with plan and discharged in stable condition.   ____________________________________________  FINAL CLINICAL IMPRESSION(S) / ED DIAGNOSES  Final diagnoses:  Esophagitis  Hiatal hernia     MEDICATIONS GIVEN DURING THIS VISIT:  Medications  lidocaine (XYLOCAINE) 2 % viscous mouth solution 15 mL (15 mLs Mouth/Throat Given 08/02/19 0225)  famotidine (PEPCID) tablet 20 mg (20 mg Oral Given 08/02/19 0226)  pantoprazole (PROTONIX) EC tablet 40 mg (40 mg Oral Given 08/02/19 0229)  iohexol (OMNIPAQUE) 300 MG/ML solution 100 mL (100 mLs Intravenous Contrast Given 08/02/19 0415)  iohexol (OMNIPAQUE) 300 MG/ML solution 50 mL (50 mLs Intravenous Contrast Given 08/02/19  0514)     NEW OUTPATIENT MEDICATIONS STARTED DURING THIS VISIT:  New Prescriptions   FAMOTIDINE (PEPCID) 20 MG TABLET    Take 1 tablet (20 mg total) by mouth 2 (two) times daily.   PANTOPRAZOLE (PROTONIX) 20 MG TABLET    Take 1 tablet (20 mg total) by mouth daily.   SUCRALFATE (CARAFATE) 1 GM/10ML SUSPENSION    Take 10 mLs (1 g total) by mouth 4 (four) times daily -  with meals and at bedtime.    Note:  This note was prepared with assistance of Dragon voice recognition software. Occasional wrong-word or sound-a-like substitutions may have occurred due to the inherent limitations of voice recognition software.   Zameer Borman, Corene Cornea, MD 08/02/19 [phone]

## 2019-10-05 DIAGNOSIS — K828 Other specified diseases of gallbladder: Secondary | ICD-10-CM | POA: Diagnosis not present

## 2019-10-05 DIAGNOSIS — K2980 Duodenitis without bleeding: Secondary | ICD-10-CM | POA: Diagnosis not present

## 2019-10-05 DIAGNOSIS — K2900 Acute gastritis without bleeding: Secondary | ICD-10-CM | POA: Diagnosis not present

## 2019-10-05 DIAGNOSIS — K449 Diaphragmatic hernia without obstruction or gangrene: Secondary | ICD-10-CM | POA: Diagnosis not present

## 2019-10-05 DIAGNOSIS — K219 Gastro-esophageal reflux disease without esophagitis: Secondary | ICD-10-CM | POA: Diagnosis not present

## 2019-10-19 DIAGNOSIS — Z1231 Encounter for screening mammogram for malignant neoplasm of breast: Secondary | ICD-10-CM | POA: Diagnosis not present

## 2019-10-30 DIAGNOSIS — R928 Other abnormal and inconclusive findings on diagnostic imaging of breast: Secondary | ICD-10-CM | POA: Diagnosis not present

## 2019-11-07 DIAGNOSIS — M8589 Other specified disorders of bone density and structure, multiple sites: Secondary | ICD-10-CM | POA: Diagnosis not present

## 2019-11-08 DIAGNOSIS — L720 Epidermal cyst: Secondary | ICD-10-CM | POA: Diagnosis not present

## 2019-11-08 DIAGNOSIS — L918 Other hypertrophic disorders of the skin: Secondary | ICD-10-CM | POA: Diagnosis not present

## 2019-11-08 DIAGNOSIS — Z85828 Personal history of other malignant neoplasm of skin: Secondary | ICD-10-CM | POA: Diagnosis not present

## 2019-11-08 DIAGNOSIS — L821 Other seborrheic keratosis: Secondary | ICD-10-CM | POA: Diagnosis not present

## 2019-11-08 DIAGNOSIS — L814 Other melanin hyperpigmentation: Secondary | ICD-10-CM | POA: Diagnosis not present

## 2019-11-08 DIAGNOSIS — D1801 Hemangioma of skin and subcutaneous tissue: Secondary | ICD-10-CM | POA: Diagnosis not present

## 2019-11-14 DIAGNOSIS — I10 Essential (primary) hypertension: Secondary | ICD-10-CM | POA: Diagnosis not present

## 2019-11-14 DIAGNOSIS — R7301 Impaired fasting glucose: Secondary | ICD-10-CM | POA: Diagnosis not present

## 2019-11-14 DIAGNOSIS — E782 Mixed hyperlipidemia: Secondary | ICD-10-CM | POA: Diagnosis not present

## 2019-11-14 DIAGNOSIS — M810 Age-related osteoporosis without current pathological fracture: Secondary | ICD-10-CM | POA: Diagnosis not present

## 2019-11-14 DIAGNOSIS — Z23 Encounter for immunization: Secondary | ICD-10-CM | POA: Diagnosis not present

## 2019-11-14 DIAGNOSIS — R7303 Prediabetes: Secondary | ICD-10-CM | POA: Diagnosis not present

## 2019-11-23 DIAGNOSIS — K589 Irritable bowel syndrome without diarrhea: Secondary | ICD-10-CM | POA: Diagnosis not present

## 2019-11-23 DIAGNOSIS — R7303 Prediabetes: Secondary | ICD-10-CM | POA: Diagnosis not present

## 2019-11-23 DIAGNOSIS — J3089 Other allergic rhinitis: Secondary | ICD-10-CM | POA: Diagnosis not present

## 2019-11-23 DIAGNOSIS — R109 Unspecified abdominal pain: Secondary | ICD-10-CM | POA: Diagnosis not present

## 2019-11-23 DIAGNOSIS — M810 Age-related osteoporosis without current pathological fracture: Secondary | ICD-10-CM | POA: Diagnosis not present

## 2019-11-23 DIAGNOSIS — I10 Essential (primary) hypertension: Secondary | ICD-10-CM | POA: Diagnosis not present

## 2019-11-23 DIAGNOSIS — E782 Mixed hyperlipidemia: Secondary | ICD-10-CM | POA: Diagnosis not present

## 2019-11-23 DIAGNOSIS — R911 Solitary pulmonary nodule: Secondary | ICD-10-CM | POA: Diagnosis not present

## 2019-11-23 DIAGNOSIS — R7301 Impaired fasting glucose: Secondary | ICD-10-CM | POA: Diagnosis not present

## 2019-11-23 DIAGNOSIS — R1900 Intra-abdominal and pelvic swelling, mass and lump, unspecified site: Secondary | ICD-10-CM | POA: Diagnosis not present

## 2019-11-23 DIAGNOSIS — K219 Gastro-esophageal reflux disease without esophagitis: Secondary | ICD-10-CM | POA: Diagnosis not present

## 2019-12-06 ENCOUNTER — Other Ambulatory Visit (HOSPITAL_COMMUNITY): Payer: Self-pay | Admitting: Endocrinology

## 2019-12-06 ENCOUNTER — Other Ambulatory Visit: Payer: Self-pay | Admitting: Endocrinology

## 2019-12-06 DIAGNOSIS — R1900 Intra-abdominal and pelvic swelling, mass and lump, unspecified site: Secondary | ICD-10-CM

## 2019-12-06 DIAGNOSIS — R911 Solitary pulmonary nodule: Secondary | ICD-10-CM

## 2019-12-06 DIAGNOSIS — L720 Epidermal cyst: Secondary | ICD-10-CM | POA: Diagnosis not present

## 2019-12-12 DIAGNOSIS — Z23 Encounter for immunization: Secondary | ICD-10-CM | POA: Diagnosis not present

## 2019-12-28 ENCOUNTER — Ambulatory Visit (HOSPITAL_COMMUNITY)
Admission: RE | Admit: 2019-12-28 | Discharge: 2019-12-28 | Disposition: A | Discharge status: Home / Self Care | Payer: Medicare Other | Source: Ambulatory Visit | Attending: Endocrinology | Admitting: Endocrinology

## 2019-12-28 ENCOUNTER — Other Ambulatory Visit: Payer: Self-pay

## 2019-12-28 DIAGNOSIS — R1900 Intra-abdominal and pelvic swelling, mass and lump, unspecified site: Secondary | ICD-10-CM

## 2019-12-28 DIAGNOSIS — N83292 Other ovarian cyst, left side: Secondary | ICD-10-CM | POA: Diagnosis not present

## 2019-12-28 DIAGNOSIS — R911 Solitary pulmonary nodule: Secondary | ICD-10-CM | POA: Diagnosis not present

## 2020-01-02 DIAGNOSIS — K219 Gastro-esophageal reflux disease without esophagitis: Secondary | ICD-10-CM | POA: Diagnosis not present

## 2020-01-02 DIAGNOSIS — N83202 Unspecified ovarian cyst, left side: Secondary | ICD-10-CM | POA: Diagnosis not present

## 2020-01-02 DIAGNOSIS — R911 Solitary pulmonary nodule: Secondary | ICD-10-CM | POA: Diagnosis not present

## 2020-01-02 DIAGNOSIS — K8020 Calculus of gallbladder without cholecystitis without obstruction: Secondary | ICD-10-CM | POA: Diagnosis not present

## 2020-01-04 DIAGNOSIS — R1900 Intra-abdominal and pelvic swelling, mass and lump, unspecified site: Secondary | ICD-10-CM | POA: Diagnosis not present

## 2020-01-04 DIAGNOSIS — K589 Irritable bowel syndrome without diarrhea: Secondary | ICD-10-CM | POA: Diagnosis not present

## 2020-01-04 DIAGNOSIS — J3089 Other allergic rhinitis: Secondary | ICD-10-CM | POA: Diagnosis not present

## 2020-01-04 DIAGNOSIS — E782 Mixed hyperlipidemia: Secondary | ICD-10-CM | POA: Diagnosis not present

## 2020-01-04 DIAGNOSIS — K219 Gastro-esophageal reflux disease without esophagitis: Secondary | ICD-10-CM | POA: Diagnosis not present

## 2020-01-04 DIAGNOSIS — R7301 Impaired fasting glucose: Secondary | ICD-10-CM | POA: Diagnosis not present

## 2020-01-04 DIAGNOSIS — R7303 Prediabetes: Secondary | ICD-10-CM | POA: Diagnosis not present

## 2020-01-04 DIAGNOSIS — R109 Unspecified abdominal pain: Secondary | ICD-10-CM | POA: Diagnosis not present

## 2020-01-04 DIAGNOSIS — I10 Essential (primary) hypertension: Secondary | ICD-10-CM | POA: Diagnosis not present

## 2020-01-04 DIAGNOSIS — R911 Solitary pulmonary nodule: Secondary | ICD-10-CM | POA: Diagnosis not present

## 2020-01-04 DIAGNOSIS — M810 Age-related osteoporosis without current pathological fracture: Secondary | ICD-10-CM | POA: Diagnosis not present

## 2020-01-10 DIAGNOSIS — J3089 Other allergic rhinitis: Secondary | ICD-10-CM | POA: Diagnosis not present

## 2020-01-10 DIAGNOSIS — R911 Solitary pulmonary nodule: Secondary | ICD-10-CM | POA: Diagnosis not present

## 2020-01-10 DIAGNOSIS — R1907 Generalized intra-abdominal and pelvic swelling, mass and lump: Secondary | ICD-10-CM | POA: Diagnosis not present

## 2020-01-14 DIAGNOSIS — R911 Solitary pulmonary nodule: Secondary | ICD-10-CM | POA: Diagnosis not present

## 2020-01-14 DIAGNOSIS — N9489 Other specified conditions associated with female genital organs and menstrual cycle: Secondary | ICD-10-CM | POA: Diagnosis not present

## 2020-01-16 DIAGNOSIS — R1909 Other intra-abdominal and pelvic swelling, mass and lump: Secondary | ICD-10-CM | POA: Diagnosis not present

## 2020-01-16 DIAGNOSIS — R911 Solitary pulmonary nodule: Secondary | ICD-10-CM | POA: Diagnosis not present

## 2020-01-16 DIAGNOSIS — R1032 Left lower quadrant pain: Secondary | ICD-10-CM | POA: Diagnosis not present

## 2020-01-21 DIAGNOSIS — R911 Solitary pulmonary nodule: Secondary | ICD-10-CM | POA: Diagnosis not present

## 2020-01-22 DIAGNOSIS — Z8679 Personal history of other diseases of the circulatory system: Secondary | ICD-10-CM | POA: Diagnosis not present

## 2020-01-22 DIAGNOSIS — R1900 Intra-abdominal and pelvic swelling, mass and lump, unspecified site: Secondary | ICD-10-CM | POA: Diagnosis not present

## 2020-01-22 DIAGNOSIS — K219 Gastro-esophageal reflux disease without esophagitis: Secondary | ICD-10-CM | POA: Diagnosis not present

## 2020-01-22 DIAGNOSIS — Z01810 Encounter for preprocedural cardiovascular examination: Secondary | ICD-10-CM | POA: Diagnosis not present

## 2020-01-22 DIAGNOSIS — R911 Solitary pulmonary nodule: Secondary | ICD-10-CM | POA: Diagnosis not present

## 2020-01-22 DIAGNOSIS — R102 Pelvic and perineal pain: Secondary | ICD-10-CM | POA: Diagnosis not present

## 2020-01-24 DIAGNOSIS — R1900 Intra-abdominal and pelvic swelling, mass and lump, unspecified site: Secondary | ICD-10-CM | POA: Diagnosis not present

## 2020-01-24 DIAGNOSIS — R911 Solitary pulmonary nodule: Secondary | ICD-10-CM | POA: Diagnosis not present

## 2020-02-08 DIAGNOSIS — D270 Benign neoplasm of right ovary: Secondary | ICD-10-CM | POA: Diagnosis not present

## 2020-02-08 DIAGNOSIS — D271 Benign neoplasm of left ovary: Secondary | ICD-10-CM | POA: Diagnosis not present

## 2020-02-08 DIAGNOSIS — N83292 Other ovarian cyst, left side: Secondary | ICD-10-CM | POA: Diagnosis not present

## 2020-02-08 DIAGNOSIS — R1900 Intra-abdominal and pelvic swelling, mass and lump, unspecified site: Secondary | ICD-10-CM | POA: Diagnosis not present

## 2020-03-04 DIAGNOSIS — Z90722 Acquired absence of ovaries, bilateral: Secondary | ICD-10-CM | POA: Diagnosis not present

## 2020-03-04 DIAGNOSIS — Z48816 Encounter for surgical aftercare following surgery on the genitourinary system: Secondary | ICD-10-CM | POA: Diagnosis not present

## 2020-03-24 DIAGNOSIS — M810 Age-related osteoporosis without current pathological fracture: Secondary | ICD-10-CM | POA: Diagnosis not present

## 2020-03-24 DIAGNOSIS — R7301 Impaired fasting glucose: Secondary | ICD-10-CM | POA: Diagnosis not present

## 2020-03-24 DIAGNOSIS — E782 Mixed hyperlipidemia: Secondary | ICD-10-CM | POA: Diagnosis not present

## 2020-03-24 DIAGNOSIS — I10 Essential (primary) hypertension: Secondary | ICD-10-CM | POA: Diagnosis not present

## 2020-03-24 DIAGNOSIS — R7303 Prediabetes: Secondary | ICD-10-CM | POA: Diagnosis not present

## 2020-04-02 DIAGNOSIS — R600 Localized edema: Secondary | ICD-10-CM | POA: Diagnosis not present

## 2020-04-02 DIAGNOSIS — K5790 Diverticulosis of intestine, part unspecified, without perforation or abscess without bleeding: Secondary | ICD-10-CM | POA: Diagnosis not present

## 2020-04-02 DIAGNOSIS — I10 Essential (primary) hypertension: Secondary | ICD-10-CM | POA: Diagnosis not present

## 2020-04-02 DIAGNOSIS — E782 Mixed hyperlipidemia: Secondary | ICD-10-CM | POA: Diagnosis not present

## 2020-04-02 DIAGNOSIS — J3089 Other allergic rhinitis: Secondary | ICD-10-CM | POA: Diagnosis not present

## 2020-04-02 DIAGNOSIS — K219 Gastro-esophageal reflux disease without esophagitis: Secondary | ICD-10-CM | POA: Diagnosis not present

## 2020-04-02 DIAGNOSIS — R7301 Impaired fasting glucose: Secondary | ICD-10-CM | POA: Diagnosis not present

## 2020-04-02 DIAGNOSIS — K589 Irritable bowel syndrome without diarrhea: Secondary | ICD-10-CM | POA: Diagnosis not present

## 2020-04-02 DIAGNOSIS — M810 Age-related osteoporosis without current pathological fracture: Secondary | ICD-10-CM | POA: Diagnosis not present

## 2020-04-15 DIAGNOSIS — R911 Solitary pulmonary nodule: Secondary | ICD-10-CM | POA: Diagnosis not present

## 2020-04-15 DIAGNOSIS — R918 Other nonspecific abnormal finding of lung field: Secondary | ICD-10-CM | POA: Diagnosis not present

## 2020-04-24 DIAGNOSIS — R911 Solitary pulmonary nodule: Secondary | ICD-10-CM | POA: Diagnosis not present

## 2020-07-14 DIAGNOSIS — Z23 Encounter for immunization: Secondary | ICD-10-CM | POA: Diagnosis not present

## 2020-07-14 DIAGNOSIS — K8020 Calculus of gallbladder without cholecystitis without obstruction: Secondary | ICD-10-CM | POA: Diagnosis not present

## 2020-07-14 DIAGNOSIS — K219 Gastro-esophageal reflux disease without esophagitis: Secondary | ICD-10-CM | POA: Diagnosis not present

## 2020-08-04 DIAGNOSIS — I10 Essential (primary) hypertension: Secondary | ICD-10-CM | POA: Diagnosis not present

## 2020-08-04 DIAGNOSIS — E782 Mixed hyperlipidemia: Secondary | ICD-10-CM | POA: Diagnosis not present

## 2020-08-06 DIAGNOSIS — K589 Irritable bowel syndrome without diarrhea: Secondary | ICD-10-CM | POA: Diagnosis not present

## 2020-08-06 DIAGNOSIS — E782 Mixed hyperlipidemia: Secondary | ICD-10-CM | POA: Diagnosis not present

## 2020-08-06 DIAGNOSIS — R600 Localized edema: Secondary | ICD-10-CM | POA: Diagnosis not present

## 2020-08-06 DIAGNOSIS — R7301 Impaired fasting glucose: Secondary | ICD-10-CM | POA: Diagnosis not present

## 2020-08-06 DIAGNOSIS — I10 Essential (primary) hypertension: Secondary | ICD-10-CM | POA: Diagnosis not present

## 2020-08-06 DIAGNOSIS — K5790 Diverticulosis of intestine, part unspecified, without perforation or abscess without bleeding: Secondary | ICD-10-CM | POA: Diagnosis not present

## 2020-08-06 DIAGNOSIS — J3089 Other allergic rhinitis: Secondary | ICD-10-CM | POA: Diagnosis not present

## 2020-08-06 DIAGNOSIS — M810 Age-related osteoporosis without current pathological fracture: Secondary | ICD-10-CM | POA: Diagnosis not present

## 2020-08-06 DIAGNOSIS — K219 Gastro-esophageal reflux disease without esophagitis: Secondary | ICD-10-CM | POA: Diagnosis not present

## 2020-08-06 DIAGNOSIS — R202 Paresthesia of skin: Secondary | ICD-10-CM | POA: Diagnosis not present

## 2020-08-16 DIAGNOSIS — Z23 Encounter for immunization: Secondary | ICD-10-CM | POA: Diagnosis not present

## 2020-10-21 DIAGNOSIS — R918 Other nonspecific abnormal finding of lung field: Secondary | ICD-10-CM | POA: Diagnosis not present

## 2020-10-21 DIAGNOSIS — K449 Diaphragmatic hernia without obstruction or gangrene: Secondary | ICD-10-CM | POA: Diagnosis not present

## 2020-10-21 DIAGNOSIS — R911 Solitary pulmonary nodule: Secondary | ICD-10-CM | POA: Diagnosis not present

## 2020-10-21 DIAGNOSIS — I700 Atherosclerosis of aorta: Secondary | ICD-10-CM | POA: Diagnosis not present

## 2020-10-21 DIAGNOSIS — J984 Other disorders of lung: Secondary | ICD-10-CM | POA: Diagnosis not present

## 2020-10-23 DIAGNOSIS — Z7729 Contact with and (suspected ) exposure to other hazardous substances: Secondary | ICD-10-CM | POA: Diagnosis not present

## 2020-10-23 DIAGNOSIS — R911 Solitary pulmonary nodule: Secondary | ICD-10-CM | POA: Diagnosis not present

## 2020-11-03 DIAGNOSIS — Z85828 Personal history of other malignant neoplasm of skin: Secondary | ICD-10-CM | POA: Diagnosis not present

## 2020-11-03 DIAGNOSIS — D1801 Hemangioma of skin and subcutaneous tissue: Secondary | ICD-10-CM | POA: Diagnosis not present

## 2020-11-03 DIAGNOSIS — L720 Epidermal cyst: Secondary | ICD-10-CM | POA: Diagnosis not present

## 2020-11-03 DIAGNOSIS — L821 Other seborrheic keratosis: Secondary | ICD-10-CM | POA: Diagnosis not present

## 2020-11-03 DIAGNOSIS — L814 Other melanin hyperpigmentation: Secondary | ICD-10-CM | POA: Diagnosis not present

## 2020-11-03 DIAGNOSIS — L570 Actinic keratosis: Secondary | ICD-10-CM | POA: Diagnosis not present

## 2020-12-03 DIAGNOSIS — I10 Essential (primary) hypertension: Secondary | ICD-10-CM | POA: Diagnosis not present

## 2020-12-03 DIAGNOSIS — E782 Mixed hyperlipidemia: Secondary | ICD-10-CM | POA: Diagnosis not present

## 2020-12-03 DIAGNOSIS — R7301 Impaired fasting glucose: Secondary | ICD-10-CM | POA: Diagnosis not present

## 2020-12-03 DIAGNOSIS — R7303 Prediabetes: Secondary | ICD-10-CM | POA: Diagnosis not present

## 2020-12-05 DIAGNOSIS — M810 Age-related osteoporosis without current pathological fracture: Secondary | ICD-10-CM | POA: Diagnosis not present

## 2020-12-05 DIAGNOSIS — R7303 Prediabetes: Secondary | ICD-10-CM | POA: Diagnosis not present

## 2020-12-05 DIAGNOSIS — K589 Irritable bowel syndrome without diarrhea: Secondary | ICD-10-CM | POA: Diagnosis not present

## 2020-12-05 DIAGNOSIS — I10 Essential (primary) hypertension: Secondary | ICD-10-CM | POA: Diagnosis not present

## 2020-12-05 DIAGNOSIS — R911 Solitary pulmonary nodule: Secondary | ICD-10-CM | POA: Diagnosis not present

## 2020-12-05 DIAGNOSIS — E782 Mixed hyperlipidemia: Secondary | ICD-10-CM | POA: Diagnosis not present

## 2020-12-05 DIAGNOSIS — R7301 Impaired fasting glucose: Secondary | ICD-10-CM | POA: Diagnosis not present

## 2020-12-05 DIAGNOSIS — J3089 Other allergic rhinitis: Secondary | ICD-10-CM | POA: Diagnosis not present

## 2020-12-05 DIAGNOSIS — K219 Gastro-esophageal reflux disease without esophagitis: Secondary | ICD-10-CM | POA: Diagnosis not present

## 2021-01-29 DIAGNOSIS — H02831 Dermatochalasis of right upper eyelid: Secondary | ICD-10-CM | POA: Diagnosis not present

## 2021-01-29 DIAGNOSIS — H02132 Senile ectropion of right lower eyelid: Secondary | ICD-10-CM | POA: Diagnosis not present

## 2021-01-29 DIAGNOSIS — Z961 Presence of intraocular lens: Secondary | ICD-10-CM | POA: Diagnosis not present

## 2021-01-29 DIAGNOSIS — H02834 Dermatochalasis of left upper eyelid: Secondary | ICD-10-CM | POA: Diagnosis not present

## 2021-01-29 DIAGNOSIS — H43813 Vitreous degeneration, bilateral: Secondary | ICD-10-CM | POA: Diagnosis not present

## 2021-01-29 DIAGNOSIS — D3131 Benign neoplasm of right choroid: Secondary | ICD-10-CM | POA: Diagnosis not present

## 2021-01-29 DIAGNOSIS — H02135 Senile ectropion of left lower eyelid: Secondary | ICD-10-CM | POA: Diagnosis not present

## 2021-01-30 DIAGNOSIS — H0279 Other degenerative disorders of eyelid and periocular area: Secondary | ICD-10-CM | POA: Diagnosis not present

## 2021-01-30 DIAGNOSIS — H04123 Dry eye syndrome of bilateral lacrimal glands: Secondary | ICD-10-CM | POA: Diagnosis not present

## 2021-01-30 DIAGNOSIS — H02834 Dermatochalasis of left upper eyelid: Secondary | ICD-10-CM | POA: Diagnosis not present

## 2021-01-30 DIAGNOSIS — H02413 Mechanical ptosis of bilateral eyelids: Secondary | ICD-10-CM | POA: Diagnosis not present

## 2021-01-30 DIAGNOSIS — H53483 Generalized contraction of visual field, bilateral: Secondary | ICD-10-CM | POA: Diagnosis not present

## 2021-01-30 DIAGNOSIS — H02831 Dermatochalasis of right upper eyelid: Secondary | ICD-10-CM | POA: Diagnosis not present

## 2021-01-30 DIAGNOSIS — H01131 Eczematous dermatitis of right upper eyelid: Secondary | ICD-10-CM | POA: Diagnosis not present

## 2021-01-30 DIAGNOSIS — H02423 Myogenic ptosis of bilateral eyelids: Secondary | ICD-10-CM | POA: Diagnosis not present

## 2021-01-30 DIAGNOSIS — H57813 Brow ptosis, bilateral: Secondary | ICD-10-CM | POA: Diagnosis not present

## 2021-02-02 IMAGING — CT CT CHEST W/O CM
2 of 4 series · 15 of 36 positions shown, 18 images · non-contrast
Comparison: CT chest dated August 02, 2019.

CLINICAL DATA: Right upper lobe nodule follow-up.

EXAM:
CT CHEST WITHOUT CONTRAST
TECHNIQUE: Multidetector CT imaging of the chest was performed following the
standard protocol without IV contrast.

[Series 2: routine chest without · axial · non-contrast · 0.63mm/px · z∈[+1124,+1418]mm · 12 of 175 slices shown, 15 images]
[im 14/175  mediastinal]
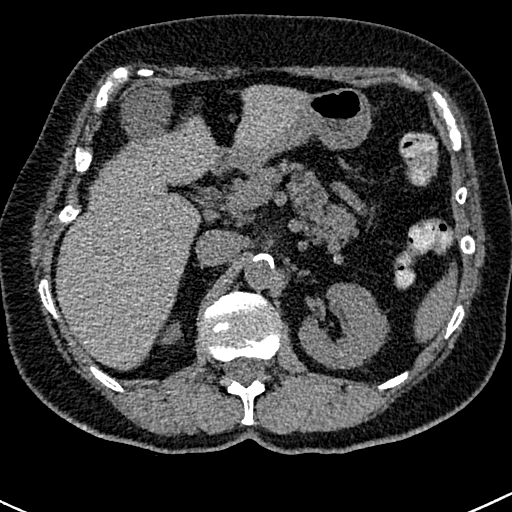
[im 14/175  lung]
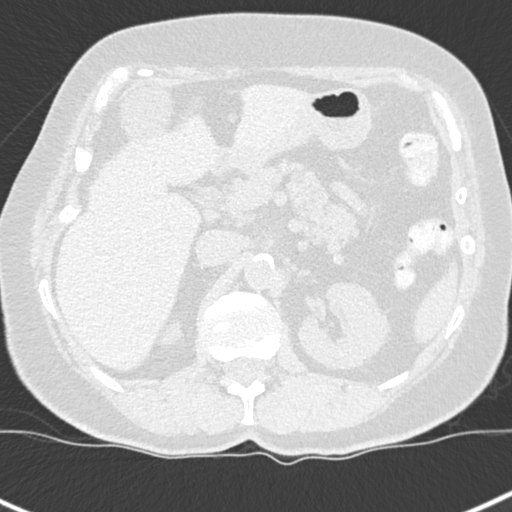
[im 27/175  lung]
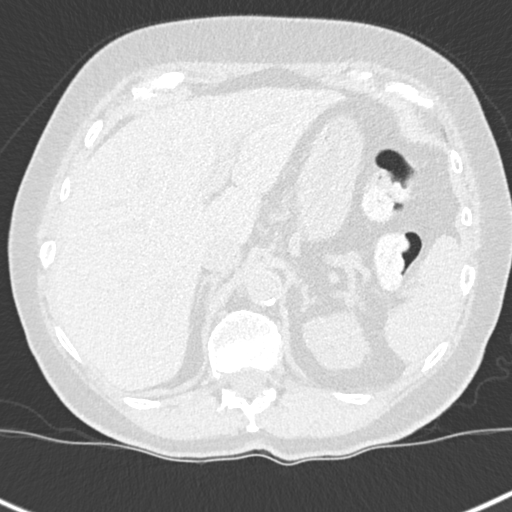
[im 41/175  lung]
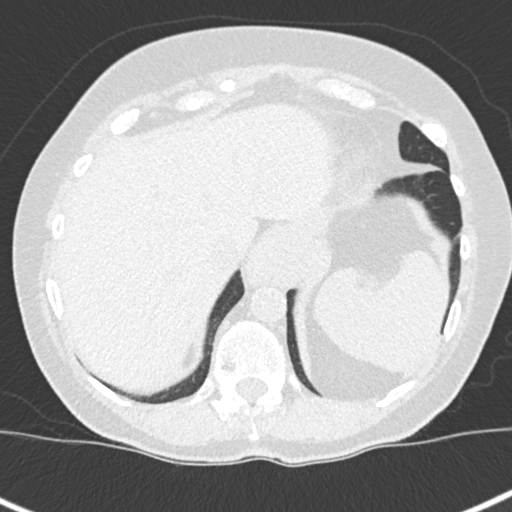
[im 54/175  lung]
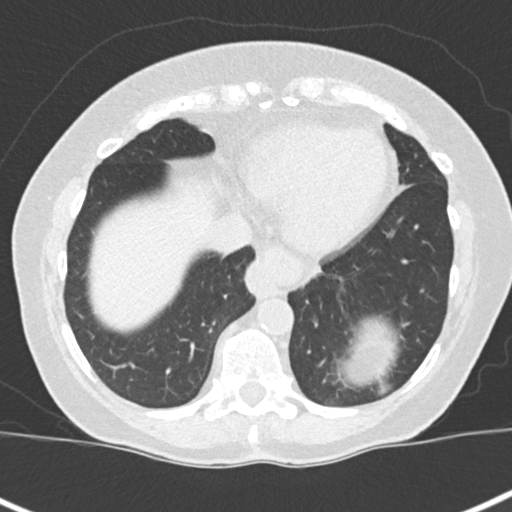
[im 67/175  mediastinal]
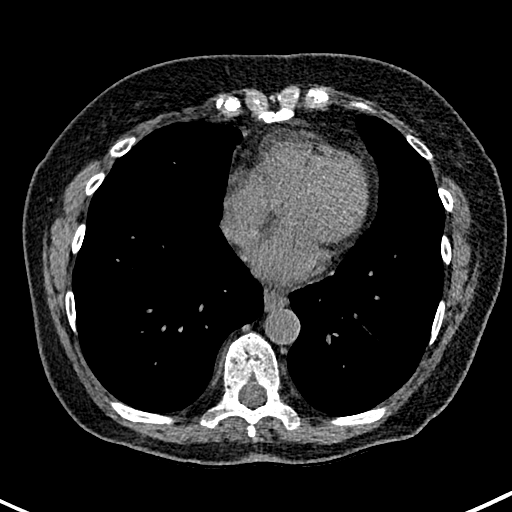
[im 67/175  lung]
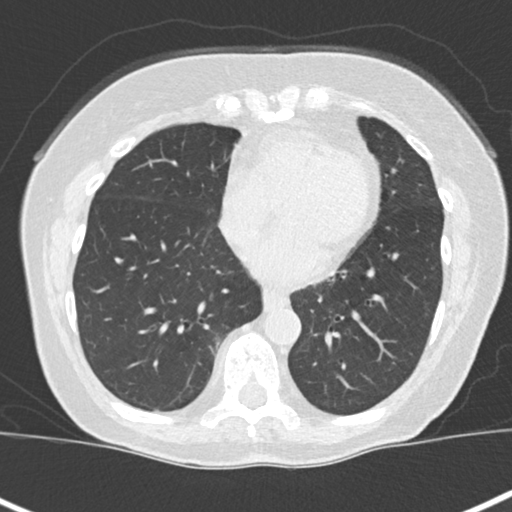
[im 81/175  lung]
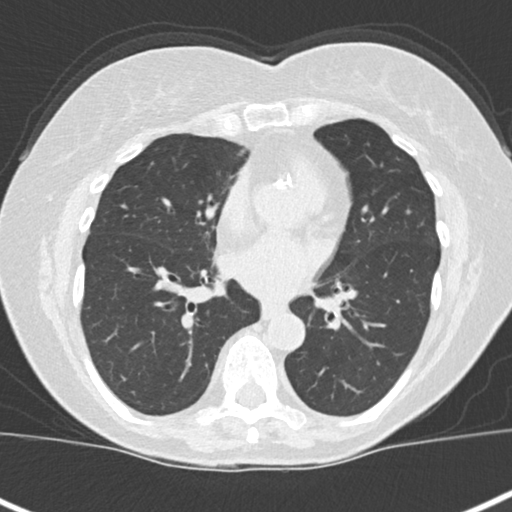
[im 94/175  lung]
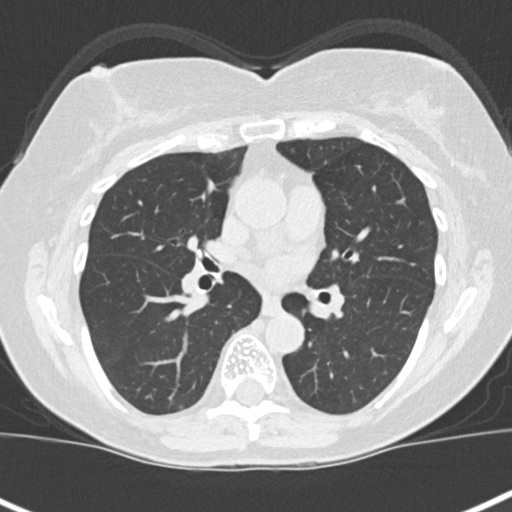
[im 108/175  lung]
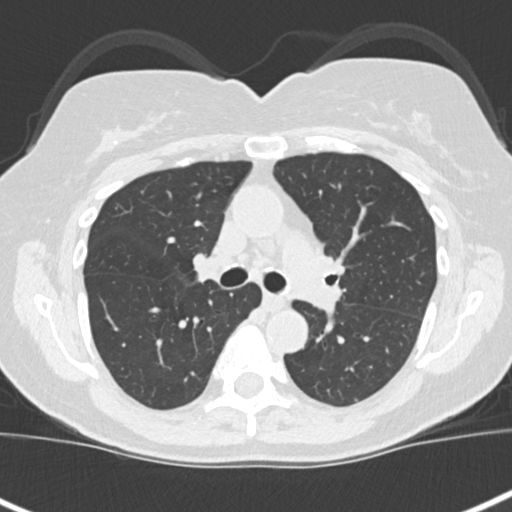
[im 121/175  mediastinal]
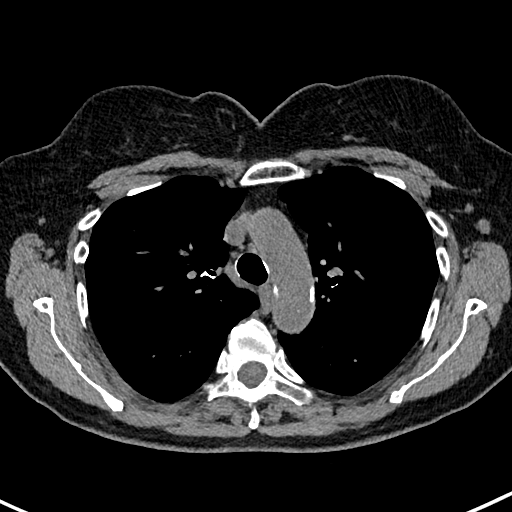
[im 121/175  lung]
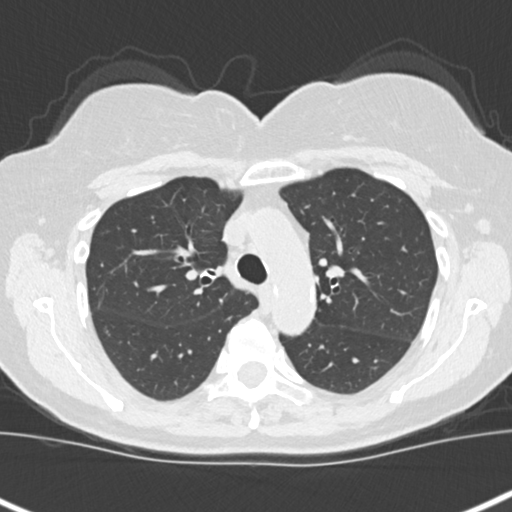
[im 134/175  lung]
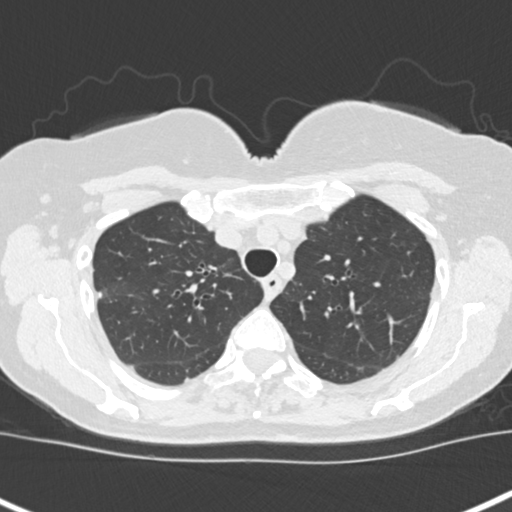
[im 148/175  lung]
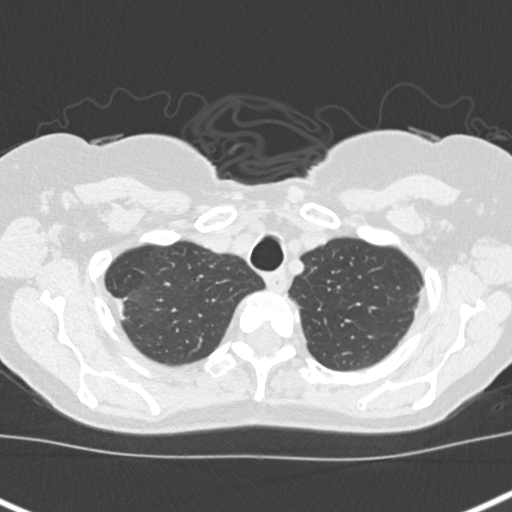
[im 161/175  lung]
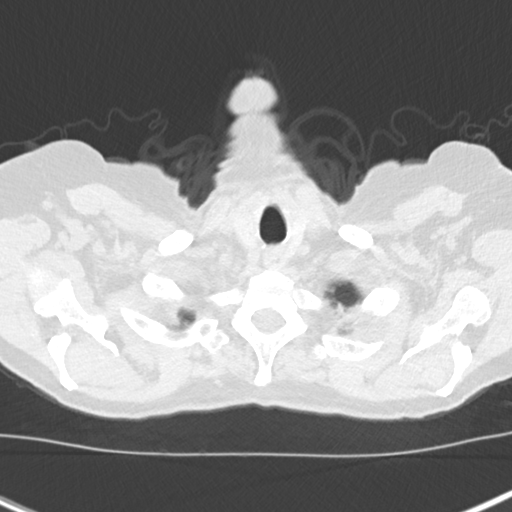

[Series 5: coronal · coronal · 0.60mm/px · 3 of 151 slices shown]
[im 31/151  lung]
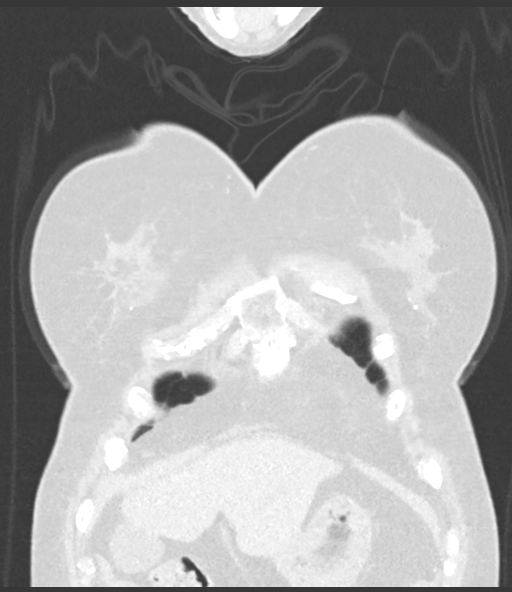
[im 61/151  lung]
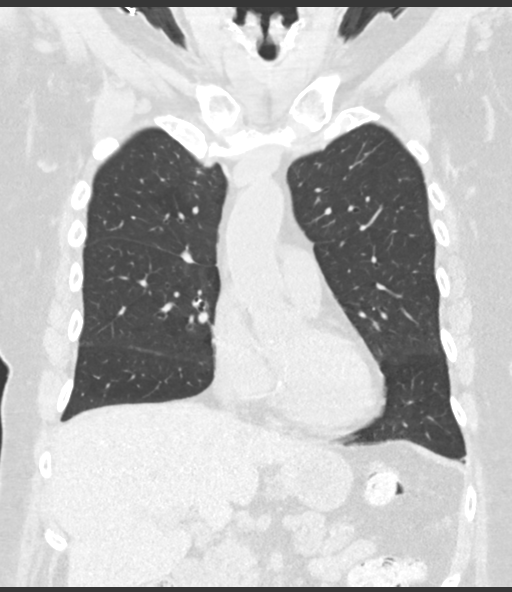
[im 91/151  lung]
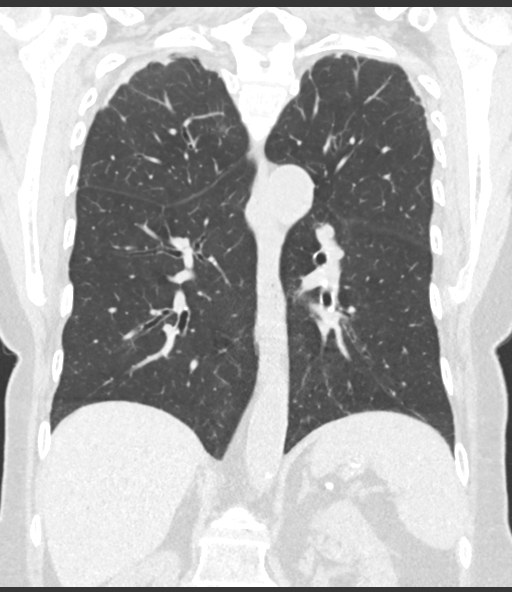

[15 of 36 positions shown; findings below may reference images not displayed]

FINDINGS: Cardiovascular: No significant vascular findings. Normal heart size.
No pericardial effusion. No thoracic aortic aneurysm. Coronary,
aortic arch, and branch vessel atherosclerotic vascular disease.

Mediastinum/Nodes: No enlarged mediastinal or axillary lymph nodes.
Unchanged 8 mm nodule in the right thyroid lobe. No followup
recommended. Continued diffuse thickening of the esophagus. The
trachea is unremarkable.

Lungs/Pleura: Slight interval enlargement of the subsolid nodule in
the medial right upper lobe (series 4, image 37), now measuring 10
mm in mean diameter, previously 8 mm. Unchanged 3 mm nodules in the
right lower lobe (series 4, images 104 and 115). No new pulmonary
nodule. Small calcified granuloma in the left upper lobe again
noted. Unchanged biapical pleuroparenchymal scarring. No focal
consolidation, pleural effusion, or pneumothorax.

Upper Abdomen: No acute abnormality. Unchanged small to moderate
hiatal hernia.

Musculoskeletal: No chest wall mass or suspicious bone lesions
identified. Multiple thoracic vertebral body hemangiomas again
noted.
IMPRESSION: 1. Slight interval enlargement of the subsolid nodule in the medial
right upper lobe, now measuring 10 mm in mean diameter, previously 8
mm. Interval growth is concerning for adenocarcinoma. Consultation
with pulmonary medicine or thoracic surgery is suggested, as
clinically appropriate.
2. Chronic small to moderate hiatal hernia and diffuse esophageal
wall thickening, suggestive of esophagitis.
3.  Aortic atherosclerosis (B1BCP-A22.2).

## 2021-02-02 IMAGING — US US PELVIS COMPLETE WITH TRANSVAGINAL
1 series · 13 of 25 positions shown · non-contrast
Comparison: 08/02/2019 CT chest, abdomen and pelvis.

CLINICAL DATA: 85-year-old postmenopausal female presents for
follow-up of cystic left adnexal mass seen on prior CT
abdomen/pelvis study.

EXAM:
TRANSABDOMINAL AND TRANSVAGINAL ULTRASOUND OF PELVIS
TECHNIQUE: Both transabdominal and transvaginal ultrasound examinations of the
pelvis were performed. Transabdominal technique was performed for
global imaging of the pelvis including uterus, ovaries, adnexal
regions, and pelvic cul-de-sac. It was necessary to proceed with
endovaginal exam following the transabdominal exam to visualize the
endometrium and adnexa.

[Series 1: us pelvic complete with transvaginal · 65 acquisitions, 13 frames shown]
[im 1/65]
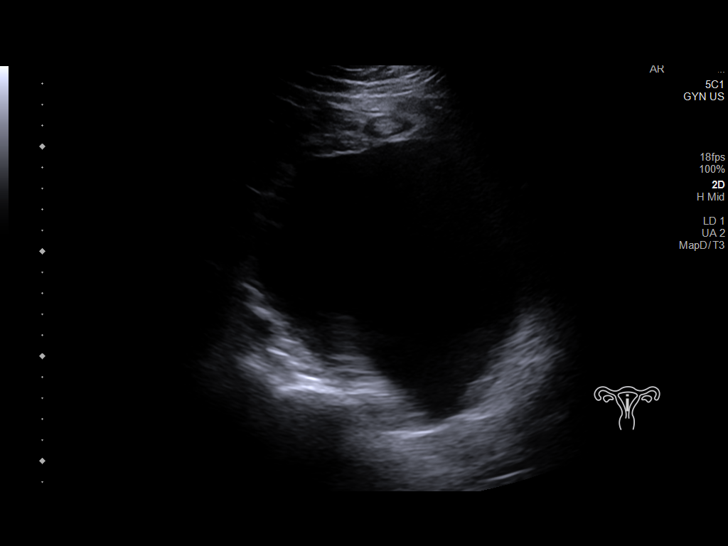
[im 6/65]
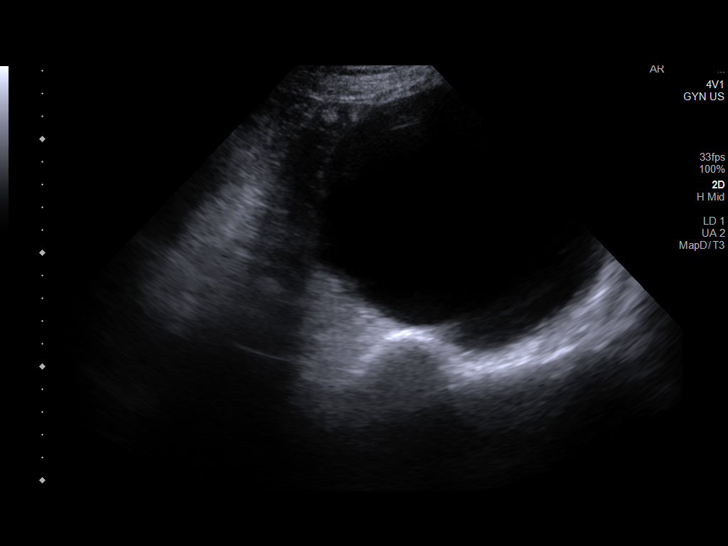
[im 11/65]
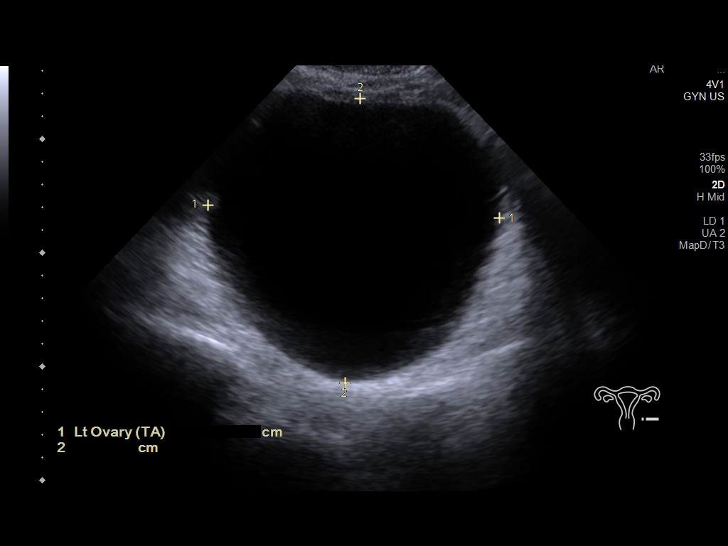
[im 17/65]
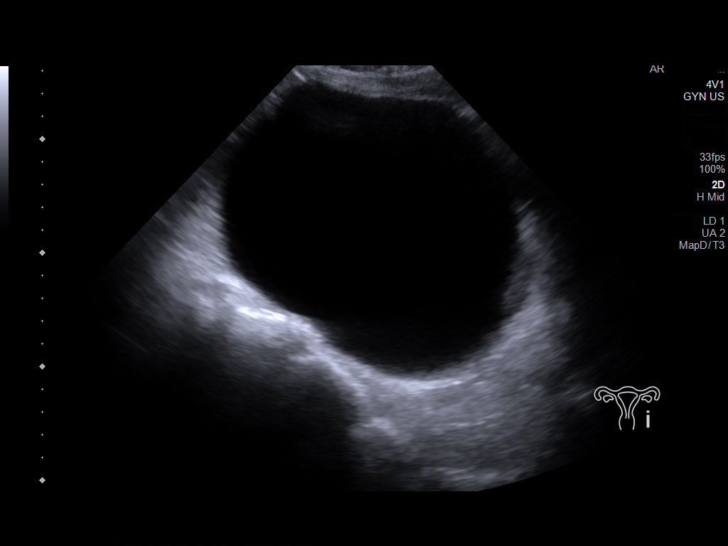
[im 22/65]
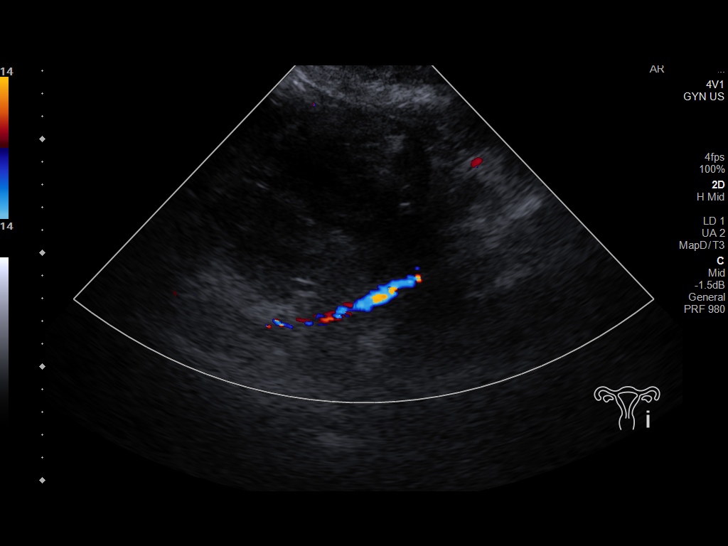
[im 27/65]
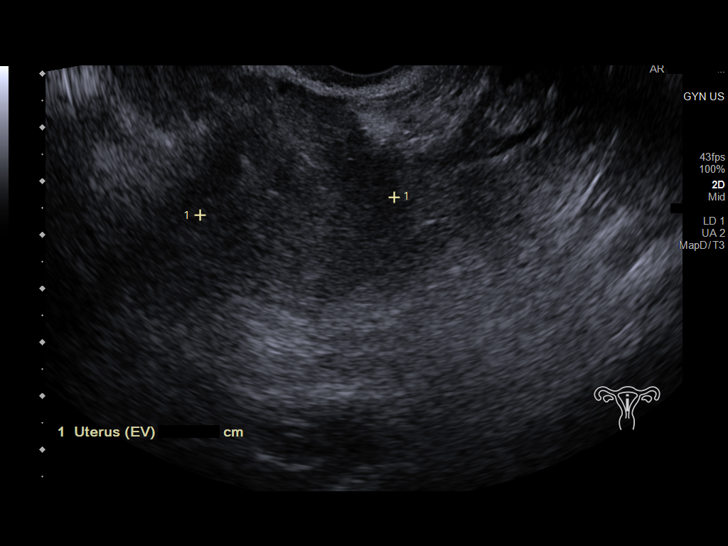
[im 33/65]
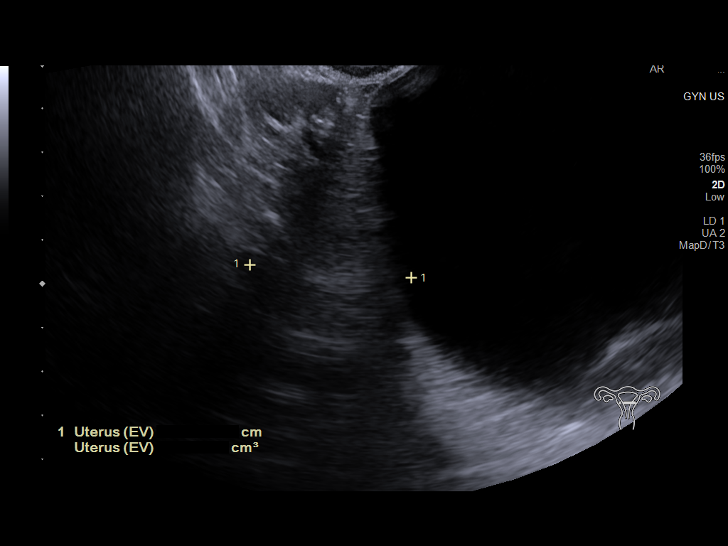
[im 38/65]
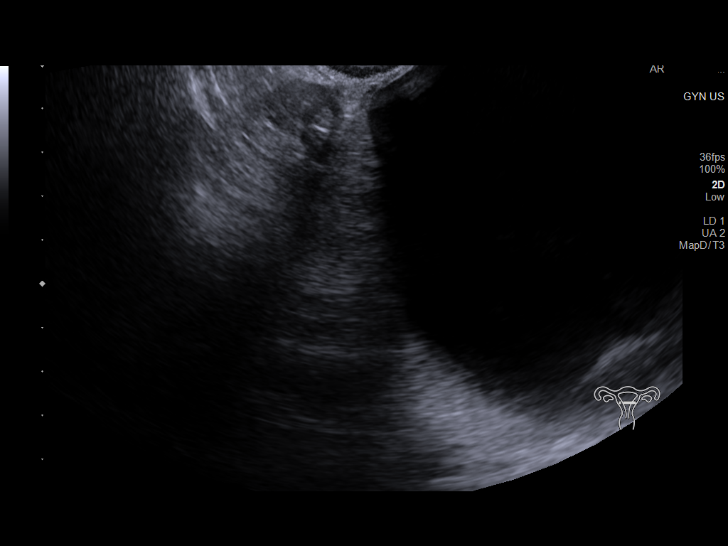
[im 43/65]
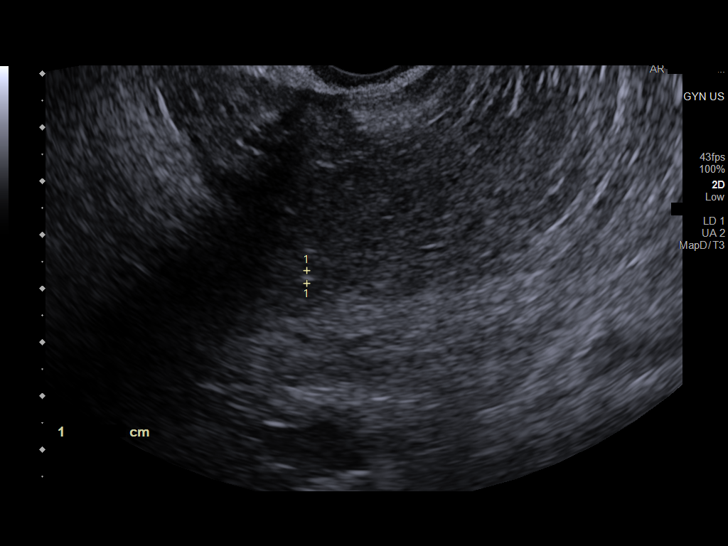
[im 49/65]
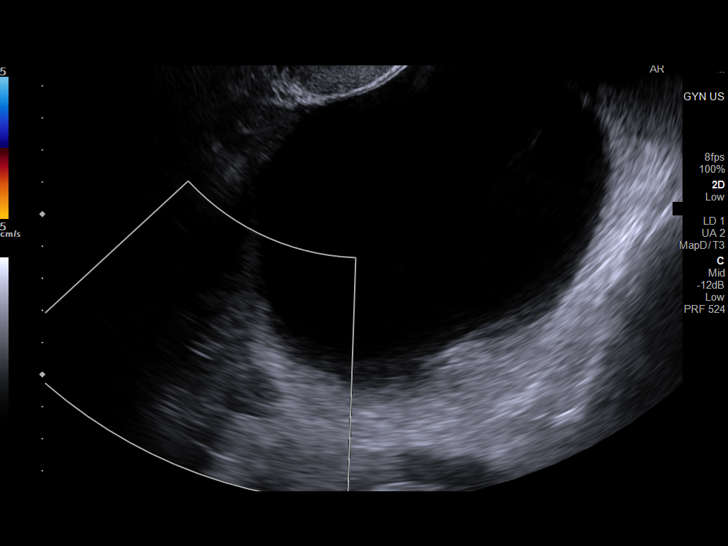
[im 54/65]
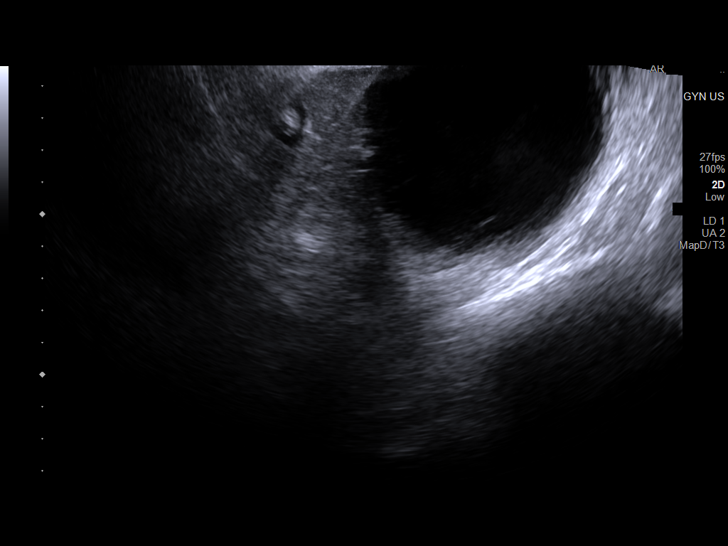
[im 59/65]
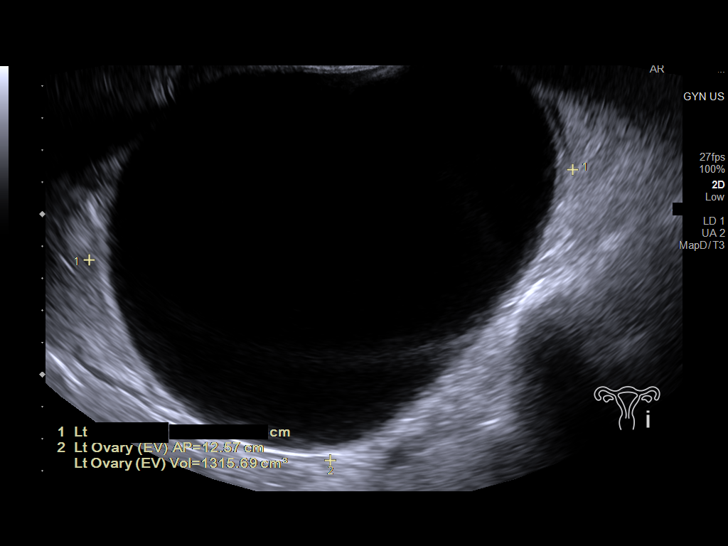
[im 65/65]
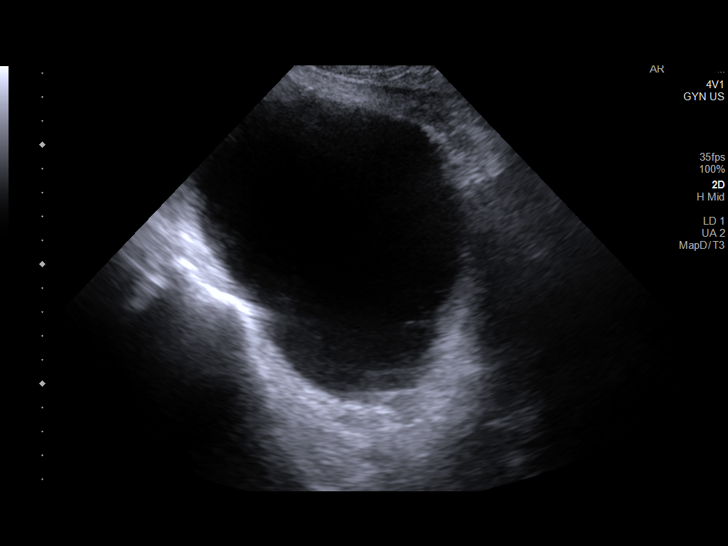

[13 of 25 positions shown; findings below may reference images not displayed]

FINDINGS: Uterus

Measurements: 4.4 x 4.4 x 3.7 cm = volume: 31 mL. Atrophic uterus
with no uterine fibroids.

Endometrium

Thickness: 2 mm. Thin endometrium poorly visualized with no
endometrial cavity fluid or focal endometrial mass.

Right ovary

Measurements: 2.3 x 1.8 x 1.8 cm = volume: 3.9 mL. Normal
appearance/no adnexal mass.

Left ovary

Measurements: 15.3 x 12.6 x 13.0 cm = volume: 1,316 mL. Left ovary
is largely replaced by a 14.2 x 12.2 x 12.8 cm complex cyst with a 7
mm solid mural nodule and no internal vascularity, which measured
14.1 x 11.7 cm on 08/02/2019 CT abdomen/pelvis study, not
appreciably changed in size.

Other findings

No abnormal free fluid.
IMPRESSION: 1. Complex 14.2 cm left ovarian cyst with 0.7 cm solid mural nodule,
stable in size since 08/02/2019 CT abdomen/pelvis study. Cystic left
ovarian neoplasm cannot be excluded.
2. Otherwise normal postmenopausal pelvic ultrasound. No free fluid.

## 2021-02-11 DIAGNOSIS — H53483 Generalized contraction of visual field, bilateral: Secondary | ICD-10-CM | POA: Diagnosis not present

## 2021-03-10 DIAGNOSIS — R7301 Impaired fasting glucose: Secondary | ICD-10-CM | POA: Diagnosis not present

## 2021-03-10 DIAGNOSIS — R7303 Prediabetes: Secondary | ICD-10-CM | POA: Diagnosis not present

## 2021-03-10 DIAGNOSIS — I10 Essential (primary) hypertension: Secondary | ICD-10-CM | POA: Diagnosis not present

## 2021-03-10 DIAGNOSIS — E782 Mixed hyperlipidemia: Secondary | ICD-10-CM | POA: Diagnosis not present

## 2021-03-11 DIAGNOSIS — R7303 Prediabetes: Secondary | ICD-10-CM | POA: Diagnosis not present

## 2021-03-11 DIAGNOSIS — R7301 Impaired fasting glucose: Secondary | ICD-10-CM | POA: Diagnosis not present

## 2021-03-11 DIAGNOSIS — I10 Essential (primary) hypertension: Secondary | ICD-10-CM | POA: Diagnosis not present

## 2021-03-11 DIAGNOSIS — R911 Solitary pulmonary nodule: Secondary | ICD-10-CM | POA: Diagnosis not present

## 2021-03-11 DIAGNOSIS — M810 Age-related osteoporosis without current pathological fracture: Secondary | ICD-10-CM | POA: Diagnosis not present

## 2021-03-11 DIAGNOSIS — E782 Mixed hyperlipidemia: Secondary | ICD-10-CM | POA: Diagnosis not present

## 2021-03-11 DIAGNOSIS — K219 Gastro-esophageal reflux disease without esophagitis: Secondary | ICD-10-CM | POA: Diagnosis not present

## 2021-03-11 DIAGNOSIS — K589 Irritable bowel syndrome without diarrhea: Secondary | ICD-10-CM | POA: Diagnosis not present

## 2021-03-11 DIAGNOSIS — J3089 Other allergic rhinitis: Secondary | ICD-10-CM | POA: Diagnosis not present

## 2021-04-28 DIAGNOSIS — H53483 Generalized contraction of visual field, bilateral: Secondary | ICD-10-CM | POA: Diagnosis not present

## 2021-04-28 DIAGNOSIS — H02423 Myogenic ptosis of bilateral eyelids: Secondary | ICD-10-CM | POA: Diagnosis not present

## 2021-04-28 DIAGNOSIS — H57813 Brow ptosis, bilateral: Secondary | ICD-10-CM | POA: Diagnosis not present

## 2021-04-28 DIAGNOSIS — H02834 Dermatochalasis of left upper eyelid: Secondary | ICD-10-CM | POA: Diagnosis not present

## 2021-04-28 DIAGNOSIS — H02831 Dermatochalasis of right upper eyelid: Secondary | ICD-10-CM | POA: Diagnosis not present

## 2021-04-28 DIAGNOSIS — H02413 Mechanical ptosis of bilateral eyelids: Secondary | ICD-10-CM | POA: Diagnosis not present

## 2021-06-18 DIAGNOSIS — R7301 Impaired fasting glucose: Secondary | ICD-10-CM | POA: Diagnosis not present

## 2021-06-18 DIAGNOSIS — E782 Mixed hyperlipidemia: Secondary | ICD-10-CM | POA: Diagnosis not present

## 2021-06-18 DIAGNOSIS — K219 Gastro-esophageal reflux disease without esophagitis: Secondary | ICD-10-CM | POA: Diagnosis not present

## 2021-06-18 DIAGNOSIS — Z23 Encounter for immunization: Secondary | ICD-10-CM | POA: Diagnosis not present

## 2021-06-18 DIAGNOSIS — I10 Essential (primary) hypertension: Secondary | ICD-10-CM | POA: Diagnosis not present

## 2021-06-18 DIAGNOSIS — M85859 Other specified disorders of bone density and structure, unspecified thigh: Secondary | ICD-10-CM | POA: Diagnosis not present

## 2021-07-03 DIAGNOSIS — Z23 Encounter for immunization: Secondary | ICD-10-CM | POA: Diagnosis not present

## 2021-10-15 DIAGNOSIS — I10 Essential (primary) hypertension: Secondary | ICD-10-CM | POA: Diagnosis not present

## 2021-10-15 DIAGNOSIS — R7301 Impaired fasting glucose: Secondary | ICD-10-CM | POA: Diagnosis not present

## 2021-10-15 DIAGNOSIS — E782 Mixed hyperlipidemia: Secondary | ICD-10-CM | POA: Diagnosis not present

## 2021-10-15 DIAGNOSIS — M85859 Other specified disorders of bone density and structure, unspecified thigh: Secondary | ICD-10-CM | POA: Diagnosis not present

## 2021-10-15 DIAGNOSIS — R7989 Other specified abnormal findings of blood chemistry: Secondary | ICD-10-CM | POA: Diagnosis not present

## 2021-10-22 DIAGNOSIS — Z1231 Encounter for screening mammogram for malignant neoplasm of breast: Secondary | ICD-10-CM | POA: Diagnosis not present

## 2021-10-22 LAB — HM MAMMOGRAPHY

## 2021-10-23 DIAGNOSIS — I700 Atherosclerosis of aorta: Secondary | ICD-10-CM | POA: Diagnosis not present

## 2021-10-23 DIAGNOSIS — R7301 Impaired fasting glucose: Secondary | ICD-10-CM | POA: Diagnosis not present

## 2021-10-23 DIAGNOSIS — Z23 Encounter for immunization: Secondary | ICD-10-CM | POA: Diagnosis not present

## 2021-10-23 DIAGNOSIS — Z0000 Encounter for general adult medical examination without abnormal findings: Secondary | ICD-10-CM | POA: Diagnosis not present

## 2021-10-23 DIAGNOSIS — M85859 Other specified disorders of bone density and structure, unspecified thigh: Secondary | ICD-10-CM | POA: Diagnosis not present

## 2021-10-23 DIAGNOSIS — E782 Mixed hyperlipidemia: Secondary | ICD-10-CM | POA: Diagnosis not present

## 2021-10-23 DIAGNOSIS — Z1331 Encounter for screening for depression: Secondary | ICD-10-CM | POA: Diagnosis not present

## 2021-10-23 DIAGNOSIS — Z1339 Encounter for screening examination for other mental health and behavioral disorders: Secondary | ICD-10-CM | POA: Diagnosis not present

## 2021-10-23 DIAGNOSIS — I10 Essential (primary) hypertension: Secondary | ICD-10-CM | POA: Diagnosis not present

## 2021-10-23 DIAGNOSIS — R911 Solitary pulmonary nodule: Secondary | ICD-10-CM | POA: Diagnosis not present

## 2021-10-23 DIAGNOSIS — I2510 Atherosclerotic heart disease of native coronary artery without angina pectoris: Secondary | ICD-10-CM | POA: Diagnosis not present

## 2021-10-23 DIAGNOSIS — K219 Gastro-esophageal reflux disease without esophagitis: Secondary | ICD-10-CM | POA: Diagnosis not present

## 2021-10-26 DIAGNOSIS — R911 Solitary pulmonary nodule: Secondary | ICD-10-CM | POA: Diagnosis not present

## 2021-10-26 DIAGNOSIS — R918 Other nonspecific abnormal finding of lung field: Secondary | ICD-10-CM | POA: Diagnosis not present

## 2021-10-29 DIAGNOSIS — R911 Solitary pulmonary nodule: Secondary | ICD-10-CM | POA: Diagnosis not present

## 2021-11-03 DIAGNOSIS — L821 Other seborrheic keratosis: Secondary | ICD-10-CM | POA: Diagnosis not present

## 2021-11-03 DIAGNOSIS — L814 Other melanin hyperpigmentation: Secondary | ICD-10-CM | POA: Diagnosis not present

## 2021-11-03 DIAGNOSIS — I788 Other diseases of capillaries: Secondary | ICD-10-CM | POA: Diagnosis not present

## 2021-11-03 DIAGNOSIS — L720 Epidermal cyst: Secondary | ICD-10-CM | POA: Diagnosis not present

## 2021-11-11 DIAGNOSIS — M8589 Other specified disorders of bone density and structure, multiple sites: Secondary | ICD-10-CM | POA: Diagnosis not present

## 2021-11-11 DIAGNOSIS — Z780 Asymptomatic menopausal state: Secondary | ICD-10-CM | POA: Diagnosis not present

## 2021-11-11 LAB — HM DEXA SCAN

## 2022-03-24 DIAGNOSIS — R911 Solitary pulmonary nodule: Secondary | ICD-10-CM | POA: Diagnosis not present

## 2022-03-24 DIAGNOSIS — I700 Atherosclerosis of aorta: Secondary | ICD-10-CM | POA: Diagnosis not present

## 2022-03-24 DIAGNOSIS — E782 Mixed hyperlipidemia: Secondary | ICD-10-CM | POA: Diagnosis not present

## 2022-03-24 DIAGNOSIS — I10 Essential (primary) hypertension: Secondary | ICD-10-CM | POA: Diagnosis not present

## 2022-03-24 DIAGNOSIS — R7301 Impaired fasting glucose: Secondary | ICD-10-CM | POA: Diagnosis not present

## 2022-03-24 DIAGNOSIS — I2510 Atherosclerotic heart disease of native coronary artery without angina pectoris: Secondary | ICD-10-CM | POA: Diagnosis not present

## 2022-04-29 DIAGNOSIS — R918 Other nonspecific abnormal finding of lung field: Secondary | ICD-10-CM | POA: Diagnosis not present

## 2022-04-29 DIAGNOSIS — R911 Solitary pulmonary nodule: Secondary | ICD-10-CM | POA: Diagnosis not present

## 2022-05-06 DIAGNOSIS — R911 Solitary pulmonary nodule: Secondary | ICD-10-CM | POA: Diagnosis not present

## 2022-07-10 ENCOUNTER — Ambulatory Visit
Admission: EM | Admit: 2022-07-10 | Discharge: 2022-07-10 | Disposition: A | Discharge status: Home / Self Care | Payer: Medicare Other

## 2022-07-10 ENCOUNTER — Ambulatory Visit (INDEPENDENT_AMBULATORY_CARE_PROVIDER_SITE_OTHER): Payer: Medicare Other

## 2022-07-10 DIAGNOSIS — R0989 Other specified symptoms and signs involving the circulatory and respiratory systems: Secondary | ICD-10-CM | POA: Diagnosis not present

## 2022-07-10 DIAGNOSIS — R053 Chronic cough: Secondary | ICD-10-CM

## 2022-07-10 DIAGNOSIS — R059 Cough, unspecified: Secondary | ICD-10-CM | POA: Diagnosis not present

## 2022-07-10 DIAGNOSIS — L03213 Periorbital cellulitis: Secondary | ICD-10-CM | POA: Diagnosis not present

## 2022-07-10 DIAGNOSIS — R911 Solitary pulmonary nodule: Secondary | ICD-10-CM | POA: Diagnosis not present

## 2022-07-10 MED ORDER — AMOXICILLIN-POT CLAVULANATE 875-125 MG PO TABS: 1.0000 | ORAL_TABLET | Freq: Two times a day (BID) | ORAL | 0 refills | Status: DC

## 2022-07-10 MED ORDER — BENZONATATE 100 MG PO CAPS: 100.0000 mg | ORAL_CAPSULE | Freq: Three times a day (TID) | ORAL | 0 refills | Status: DC | PRN

## 2022-07-10 NOTE — Discharge Instructions (Addendum)
Please make sure you start Augmentin to address the infection of your eyelid. It can also help with lung infections. I will update you about your chest x-ray results and blood work through [facility] or by phone.

## 2022-07-10 NOTE — ED Provider Notes (Signed)
[facility]  Note:  This document was prepared using Dragon voice recognition software and may include unintentional dictation errors.  MRN: [id number] DOB: Feb 17, 1934  Subjective:   Kayla [name] is a 88 y.o. female presenting for 9 to 10-day history of acute onset persistent chest congestion, coughing, coughing fits, sinus symptoms.  A few days ago she started to have more left upper eyelid swelling with pain and redness.  Patient has a history of pulmonary nodule as seen from a chest CT scan, last screening done 2021.  No overt chest pain, shortness of breath or wheezing.  No history of asthma.  Patient is not a smoker.  No current facility-administered medications for this encounter.  Current Outpatient Medications:    aspirin EC 81 MG tablet, Take 1 tablet by mouth daily., Disp: , Rfl:    famotidine (PEPCID) 20 MG tablet, Take 1 tablet (20 mg total) by mouth 2 (two) times daily., Disp: 10 tablet, Rfl: 0   pantoprazole (PROTONIX) 20 MG tablet, Take 1 tablet (20 mg total) by mouth daily., Disp: 30 tablet, Rfl: 0   sucralfate (CARAFATE) 1 GM/10ML suspension, Take 10 mLs (1 g total) by mouth 4 (four) times daily -  with meals and at bedtime., Disp: 420 mL, Rfl: 0   valsartan-hydrochlorothiazide (DIOVAN-HCT) 160-12.5 MG tablet, Take 1 tablet by mouth daily., Disp: , Rfl:    No Known Allergies  Past Medical History:  Diagnosis Date   GERD (gastroesophageal reflux disease)    Hypertension      History reviewed. No pertinent surgical history.  History reviewed. No pertinent family history.  Social History   Tobacco Use   Smoking status: Never   Smokeless tobacco: Never  Vaping Use   Vaping Use: Never used  Substance Use Topics   Alcohol use: Never   Drug use: Never    ROS   Objective:   Vitals: BP (!) 150/73 (BP Location: Right Arm)   Pulse 97   Temp 97.8 F (36.6 C) (Oral)   Resp 18   SpO2 96%   Physical Exam Constitutional:      General:  She is not in acute distress.    Appearance: Normal appearance. She is well-developed. She is not ill-appearing, toxic-appearing or diaphoretic.  HENT:     Head: Normocephalic and atraumatic.     Nose: Nose normal.     Mouth/Throat:     Mouth: Mucous membranes are moist.  Eyes:     General: No scleral icterus.       Right eye: No discharge.        Left eye: No discharge.     Extraocular Movements: Extraocular movements intact.   Cardiovascular:     Rate and Rhythm: Normal rate and regular rhythm.     Heart sounds: Normal heart sounds. No murmur heard.    No friction rub. No gallop.  Pulmonary:     Effort: Pulmonary effort is normal. No respiratory distress.     Breath sounds: No stridor. No wheezing, rhonchi or rales.     Comments: Slight decrease in lung sounds over bibasilar fields. Chest:     Chest wall: No tenderness.  Skin:    General: Skin is warm and dry.  Neurological:     General: No focal deficit present.     Mental Status: She is alert and oriented to person, place, and time.  Psychiatric:        Mood and Affect: Mood normal.  Behavior: Behavior normal.    DG Chest 2 View  Result Date: 07/10/2022 CLINICAL DATA:  Cough.  History of pulmonary nodules. EXAM: CHEST - 2 VIEW COMPARISON:  10/01/2010 FINDINGS: Lungs are hyperexpanded The lungs are clear without focal pneumonia, edema, pneumothorax or pleural effusion. The cardiopericardial silhouette is within normal limits for size. The visualized bony structures of the thorax are unremarkable. IMPRESSION: Hyperexpansion without acute cardiopulmonary findings. 11 mm ground-glass nodule seen on CT of 04/29/2022 not evident by x-ray today. Electronically Signed   By: [name] M.D.   On: 07/10/2022 11:04    Assessment and Plan :   PDMP not reviewed this encounter.  1. Preseptal cellulitis   2. Persistent cough   3. Chest congestion   4. Pulmonary nodule    Will cover for preseptal cellulitis with Augmentin.   Creatinine clearance calculated using blood work from 2022, was 15m/min.  We will recheck the creatinine level today for the use of Augmentin.  Chest x-ray negative for pneumonia.  As seen the nodule from the CT scan not evident in the x-ray today.  Follow-up with PCP. Counseled patient on potential for adverse effects with medications prescribed/recommended today, ER and return-to-clinic precautions discussed, patient verbalized understanding.    MJaynee Eagles PA-C 07/10/22 1108

## 2022-07-10 NOTE — ED Triage Notes (Signed)
Pt reports during the night coughs really hard and coughs flem up for about a week and a 2 days. About a week pts eye is pink and swollen doesn't hurt but is uncomfortable.

## 2022-07-11 LAB — BASIC METABOLIC PANEL
BUN/Creatinine Ratio: 16 (ref 12–28)
BUN: 17 mg/dL (ref 8–27)
CO2: 20 mmol/L (ref 20–29)
Calcium: 9.4 mg/dL (ref 8.7–10.3)
Chloride: 103 mmol/L (ref 96–106)
Creatinine, Ser: 1.07 mg/dL — ABNORMAL HIGH (ref 0.57–1.00)
Glucose: 117 mg/dL — ABNORMAL HIGH (ref 70–99)
Potassium: 3.8 mmol/L (ref 3.5–5.2)
Sodium: 143 mmol/L (ref 134–144)
eGFR: 51 mL/min/{1.73_m2} — ABNORMAL LOW (ref >59)

## 2022-07-26 DIAGNOSIS — Z23 Encounter for immunization: Secondary | ICD-10-CM | POA: Diagnosis not present

## 2022-07-29 ENCOUNTER — Telehealth: Payer: Self-pay

## 2022-07-29 ENCOUNTER — Ambulatory Visit (INDEPENDENT_AMBULATORY_CARE_PROVIDER_SITE_OTHER): Payer: Medicare Other | Admitting: Internal Medicine

## 2022-07-29 ENCOUNTER — Encounter: Payer: Self-pay | Admitting: Internal Medicine

## 2022-07-29 VITALS — BP 143/62 | HR 87 | Ht 69.0 in | Wt 184.0 lb

## 2022-07-29 DIAGNOSIS — R918 Other nonspecific abnormal finding of lung field: Secondary | ICD-10-CM | POA: Diagnosis not present

## 2022-07-29 DIAGNOSIS — I10 Essential (primary) hypertension: Secondary | ICD-10-CM | POA: Diagnosis not present

## 2022-07-29 DIAGNOSIS — E785 Hyperlipidemia, unspecified: Secondary | ICD-10-CM | POA: Diagnosis not present

## 2022-07-29 DIAGNOSIS — M810 Age-related osteoporosis without current pathological fracture: Secondary | ICD-10-CM | POA: Diagnosis not present

## 2022-07-29 DIAGNOSIS — R7303 Prediabetes: Secondary | ICD-10-CM

## 2022-07-29 DIAGNOSIS — Z0001 Encounter for general adult medical examination with abnormal findings: Secondary | ICD-10-CM | POA: Diagnosis not present

## 2022-07-29 DIAGNOSIS — K219 Gastro-esophageal reflux disease without esophagitis: Secondary | ICD-10-CM

## 2022-07-29 DIAGNOSIS — Z9189 Other specified personal risk factors, not elsewhere classified: Secondary | ICD-10-CM

## 2022-07-29 NOTE — Progress Notes (Signed)
New Patient Office Visit  Subjective    Patient ID: Kayla [name], female    DOB: 19-Aug-1934  Age: 88 y.o. MRN: [id number]  CC:  Chief Complaint  Patient presents with   Establish Care   HPI [place] presents to establish care.  Kayla [name] [name] an 88 year old woman with a past medical history significant for HTN, HLD, GERD, osteoporosis, and pulmonary nodules currently under surveillance.  Kayla [name] was previously followed by Dr. Elyse Hsu in [place] and by Dr. Jacalyn Lefevre of [facility]. Today Kayla [name] doing well.  Kayla [name] [name] asymptomatic and has no acute concerns aside from wanting to establish care and ensuring that I have Kayla [name] previous CT scan results.  Chronic medical conditions and outstanding preventative care [name] discussed today are individually addressed in A/P below.  Outpatient Encounter Medications as of 07/29/2022  Medication Sig   aspirin EC 81 MG tablet Take 1 tablet by mouth daily.   famotidine (PEPCID) 20 MG tablet Take 1 tablet (20 mg total) by mouth 2 (two) times daily. (Patient taking differently: Take 20 mg by mouth daily.)   Fish Oil-Cholecalciferol (FISH OIL + D3) 1000-1000 MG-UNIT CAPS Take 2 capsules by mouth daily.   Multiple Vitamin (MULTIVITAMIN) tablet Take 1 tablet by mouth daily.   valsartan-hydrochlorothiazide (DIOVAN-HCT) 160-12.5 MG tablet Take 1 tablet by mouth daily.   [DISCONTINUED] amoxicillin-clavulanate (AUGMENTIN) 875-125 MG tablet Take 1 tablet by mouth 2 (two) times daily.   [DISCONTINUED] benzonatate (TESSALON) 100 MG capsule Take 1 capsule (100 mg total) by mouth 3 (three) times daily as needed for cough.   [DISCONTINUED] pantoprazole (PROTONIX) 20 MG tablet Take 1 tablet (20 mg total) by mouth daily.   [DISCONTINUED] sucralfate (CARAFATE) 1 GM/10ML suspension Take 10 mLs (1 g total) by mouth 4 (four) times daily -  with meals and at bedtime.   No facility-administered encounter medications on file as of 07/29/2022.    Past Medical  History:  Diagnosis Date   GERD (gastroesophageal reflux disease)    Hypertension     History reviewed. No pertinent surgical history.  History reviewed. No pertinent family history.  Social History   Socioeconomic History   Marital status: Widowed    Spouse name: Not on file   Number of children: Not on file   Years of education: Not on file   Highest education level: Not on file  Occupational History   Not on file  Tobacco Use   Smoking status: Never   Smokeless tobacco: Never  Vaping Use   Vaping Use: Never used  Substance and Sexual Activity   Alcohol use: Never   Drug use: Never   Sexual activity: Not on file  Other Topics Concern   Not on file  Social History Narrative   Not on file   Social Determinants of Health   Financial Resource Strain: Not on file  Food Insecurity: Not on file  Transportation Needs: Not on file  Physical Activity: Not on file  Stress: Not on file  Social Connections: Not on file  Intimate Partner Violence: Not on file    Review of Systems  Constitutional:  Negative for chills and fever.  HENT:  Negative for sore throat.   Respiratory:  Negative for cough and shortness of breath.   Cardiovascular:  Negative for chest pain, palpitations and leg swelling.  Gastrointestinal:  Negative for abdominal pain, blood in stool, constipation, diarrhea, nausea and vomiting.  Genitourinary:  Negative for dysuria and hematuria.  Musculoskeletal:  Negative  for myalgias.  Skin:  Negative for itching and rash.  Neurological:  Negative for dizziness and headaches.  Psychiatric/Behavioral:  Negative for depression and suicidal ideas.         Objective    BP (!) 143/62   Pulse 87   Ht '5\' 9"'$  (1.753 m)   Wt 184 lb (83.5 kg)   SpO2 96%   BMI 27.17 kg/m   Physical Exam Vitals reviewed.  Constitutional:      General: Kayla [name] [name] not in acute distress.    Appearance: Normal appearance. Kayla [name] [name] not toxic-appearing.  HENT:     Head: Normocephalic  and atraumatic.     Right Ear: External ear normal.     Left Ear: External ear normal.     Nose: Nose normal. No congestion or rhinorrhea.     Mouth/Throat:     Mouth: Mucous membranes are moist.     Pharynx: Oropharynx [name] clear. No oropharyngeal exudate or posterior oropharyngeal erythema.  Eyes:     General: No scleral icterus.    Extraocular Movements: Extraocular movements intact.     Conjunctiva/sclera: Conjunctivae normal.     Pupils: Pupils are equal, round, and reactive to light.  Cardiovascular:     Rate and Rhythm: Normal rate and regular rhythm.     Pulses: Normal pulses.     Heart sounds: Normal heart sounds. No murmur heard.    No friction rub. No gallop.  Pulmonary:     Effort: Pulmonary effort [name] normal.     Breath sounds: Normal breath sounds. No wheezing, rhonchi or rales.  Abdominal:     General: Abdomen [name] flat. Bowel sounds are normal. There [name] no distension.     Palpations: Abdomen [name] soft.     Tenderness: There [name] no abdominal tenderness.  Musculoskeletal:        General: No swelling. Normal range of motion.     Cervical back: Normal range of motion.     Right lower leg: No edema.     Left lower leg: No edema.  Lymphadenopathy:     Cervical: No cervical adenopathy.  Skin:    General: Skin [name] warm and dry.     Capillary Refill: Capillary refill takes less than 2 seconds.     Coloration: Skin [name] not jaundiced.  Neurological:     General: No focal deficit present.     Mental Status: Kayla [name] [name] alert and oriented to person, place, and time.  Psychiatric:        Mood and Affect: Mood normal.        Behavior: Behavior normal.    Assessment & Plan:   Problem List Items Addressed This Visit       Hypertension    BP 143/60 today.  Kayla [name] [name] currently prescribed valsartan-HCTZ 160-12.5 mg daily. -No medication changes today.  Continue to monitor BP at follow-up.      Multiple lung nodules    Kayla [name] [name] currently under annual surveillance for incidentally noted  lung nodules.  Kayla [name] most recent CT was performed 04/29/2022.  The right-sided nodules appeared stable at that time.  Per guidelines, repeat CT every 2 years [name] recommended until 5 years of stability has been established.  Kayla [name] [name] scheduled to undergo repeat CT chest in August 2025.      GERD (gastroesophageal reflux disease)    Symptoms well controlled with Pepcid.  No medication changes today.      Osteoporosis    Kayla [name] reports a history of osteoporosis  and states Kayla [name] recently completed a repeat DEXA scan.  Records have been requested.  Kayla [name] [name] not currently on vitamin D supplementation or treatment for osteoporosis.      Hyperlipidemia    Kayla [name] endorses a history of hyperlipidemia.  Not currently on any cholesterol-lowering therapy. -Repeat lipid panel prior to next appointment      Encounter for well adult exam with abnormal findings - Primary    Presenting today to establish care.  Available records and labs reviewed.  We have requested records from [facility] as well. -Preventative care items are largely up-to-date -Recent labs performed at [facility] largely within normal limits -We will plan for follow-up in January with baseline labs ordered prior to Kayla [name] appointment      Return in about 2 months (around 10/04/2022).   Johnette Abraham, MD

## 2022-07-29 NOTE — Telephone Encounter (Signed)
FYI patient came back in the office to let Dr Doren Custard know changed her appt to a CPE after 01.27.2023 since she just had it done 01.27.2022 at her old primary care office.  And also scheduled her the Annual Wellness Visit for 02.2024.

## 2022-07-29 NOTE — Telephone Encounter (Signed)
FYI patient came back in the office let Dr Doren Custard know sh

## 2022-07-29 NOTE — Telephone Encounter (Signed)
She has only traditional medicare only can charge office visit not CPE

## 2022-07-29 NOTE — Patient Instructions (Signed)
It was a pleasure to see you today.  Thank you for giving [place] the opportunity to be involved in your care.  Below is a brief recap of your visit and next steps.  We will plan to see you again in January  Summary You have established care today. We will repeat labs in January and scheduled follow up for your annual exam

## 2022-08-04 ENCOUNTER — Encounter: Payer: Self-pay | Admitting: Internal Medicine

## 2022-08-04 DIAGNOSIS — E785 Hyperlipidemia, unspecified: Secondary | ICD-10-CM | POA: Insufficient documentation

## 2022-08-04 DIAGNOSIS — M810 Age-related osteoporosis without current pathological fracture: Secondary | ICD-10-CM | POA: Insufficient documentation

## 2022-08-04 DIAGNOSIS — K219 Gastro-esophageal reflux disease without esophagitis: Secondary | ICD-10-CM | POA: Insufficient documentation

## 2022-08-04 DIAGNOSIS — Z0001 Encounter for general adult medical examination with abnormal findings: Secondary | ICD-10-CM | POA: Insufficient documentation

## 2022-08-04 DIAGNOSIS — M8580 Other specified disorders of bone density and structure, unspecified site: Secondary | ICD-10-CM | POA: Insufficient documentation

## 2022-08-04 DIAGNOSIS — R918 Other nonspecific abnormal finding of lung field: Secondary | ICD-10-CM | POA: Insufficient documentation

## 2022-08-04 DIAGNOSIS — I10 Essential (primary) hypertension: Secondary | ICD-10-CM | POA: Insufficient documentation

## 2022-08-04 NOTE — Assessment & Plan Note (Signed)
Presenting today to establish care.  Available records and labs reviewed.  We have requested records from [facility] as well. -Preventative care items are largely up-to-date -Recent labs performed at [facility] largely within normal limits -We will plan for follow-up in January with baseline labs ordered prior to her appointment

## 2022-08-04 NOTE — Assessment & Plan Note (Signed)
BP 143/60 today.  She is currently prescribed valsartan-HCTZ 160-12.5 mg daily. -No medication changes today.  Continue to monitor BP at follow-up.

## 2022-08-04 NOTE — Assessment & Plan Note (Signed)
She endorses a history of hyperlipidemia.  Not currently on any cholesterol-lowering therapy. -Repeat lipid panel prior to next appointment

## 2022-08-04 NOTE — Assessment & Plan Note (Signed)
Symptoms well controlled with Pepcid.  No medication changes today.

## 2022-08-04 NOTE — Assessment & Plan Note (Signed)
She is currently under annual surveillance for incidentally noted lung nodules.  Her most recent CT was performed 04/29/2022.  The right-sided nodules appeared stable at that time.  Per guidelines, repeat CT every 2 years is recommended until 5 years of stability has been established.  She is scheduled to undergo repeat CT chest in August 2025.

## 2022-08-04 NOTE — Assessment & Plan Note (Signed)
She reports a history of osteoporosis and states she recently completed a repeat DEXA scan.  Records have been requested.  She is not currently on vitamin D supplementation or treatment for osteoporosis.

## 2022-09-14 ENCOUNTER — Telehealth: Payer: Self-pay | Admitting: Internal Medicine

## 2022-09-14 ENCOUNTER — Other Ambulatory Visit: Payer: Self-pay

## 2022-09-14 MED ORDER — VALSARTAN-HYDROCHLOROTHIAZIDE 160-12.5 MG PO TABS: 1.0000 | ORAL_TABLET | Freq: Every day | ORAL | 0 refills | Status: DC

## 2022-09-14 NOTE — Telephone Encounter (Signed)
Patient came by the office and may need authorization for this medicine to be approved by new pcp here Dr Doren Custard.   Prescription Request  09/14/2022  Is this a "Controlled Substance" medicine? No  LOV: 07/29/2022  What is the name of the medication or equipment? valsartan-hydrochlorothiazide (DIOVAN-HCT) 160-12.5 MG tablet [[id number]   Have you contacted your pharmacy to request a refill? No   Which pharmacy would you like this sent to?  [facility] [address] [place] [address] Phone: [phone] Fax: [phone]    Patient notified that their request is being sent to the clinical staff for review and that they should receive a response within 2 business days.   Please advise at Mobile [phone] (mobile)

## 2022-09-15 NOTE — Telephone Encounter (Signed)
Sent message to [employer].  Claim not paid for 07/29/22 because of the Z00.01 DX we think.  Please review and remover Z00.01 if you can and re-bill per Dr Doren Custard. This is a new patient transferring care to Dr Doren Custard as PCP.  The patient does have other DX per Dr Doren Custard.

## 2022-10-04 ENCOUNTER — Ambulatory Visit: Payer: Medicare Other | Admitting: Internal Medicine

## 2022-10-11 ENCOUNTER — Other Ambulatory Visit: Payer: Self-pay

## 2022-10-11 MED ORDER — FAMOTIDINE 20 MG PO TABS: 20.0000 mg | ORAL_TABLET | Freq: Two times a day (BID) | ORAL | 0 refills | Status: DC

## 2022-10-18 ENCOUNTER — Other Ambulatory Visit: Payer: Self-pay

## 2022-10-18 DIAGNOSIS — E785 Hyperlipidemia, unspecified: Secondary | ICD-10-CM

## 2022-10-18 DIAGNOSIS — M810 Age-related osteoporosis without current pathological fracture: Secondary | ICD-10-CM

## 2022-10-18 DIAGNOSIS — R7303 Prediabetes: Secondary | ICD-10-CM

## 2022-10-18 DIAGNOSIS — I10 Essential (primary) hypertension: Secondary | ICD-10-CM | POA: Diagnosis not present

## 2022-10-18 DIAGNOSIS — Z139 Encounter for screening, unspecified: Secondary | ICD-10-CM

## 2022-10-19 LAB — CMP14+EGFR
ALT: 11 IU/L (ref 0–32)
AST: 17 IU/L (ref 0–40)
Albumin/Globulin Ratio: 1.6 (ref 1.2–2.2)
Albumin: 3.9 g/dL (ref 3.7–4.7)
Alkaline Phosphatase: 87 IU/L (ref 44–121)
BUN/Creatinine Ratio: 23 (ref 12–28)
BUN: 23 mg/dL (ref 8–27)
Bilirubin Total: 0.3 mg/dL (ref 0.0–1.2)
CO2: 25 mmol/L (ref 20–29)
Calcium: 9.3 mg/dL (ref 8.7–10.3)
Chloride: 102 mmol/L (ref 96–106)
Creatinine, Ser: 0.99 mg/dL (ref 0.57–1.00)
Globulin, Total: 2.5 g/dL (ref 1.5–4.5)
Glucose: 97 mg/dL (ref 70–99)
Potassium: 4.5 mmol/L (ref 3.5–5.2)
Sodium: 142 mmol/L (ref 134–144)
Total Protein: 6.4 g/dL (ref 6.0–8.5)
eGFR: 56 mL/min/{1.73_m2} — ABNORMAL LOW (ref >59)

## 2022-10-19 LAB — LIPID PANEL
Chol/HDL Ratio: 4.3 ratio (ref 0.0–4.4)
Cholesterol, Total: 241 mg/dL — ABNORMAL HIGH (ref 100–199)
HDL: 56 mg/dL (ref >39)
LDL Chol Calc (NIH): 165 mg/dL — ABNORMAL HIGH (ref 0–99)
Triglycerides: 113 mg/dL (ref 0–149)
VLDL Cholesterol Cal: 20 mg/dL (ref 5–40)

## 2022-10-19 LAB — CBC WITH DIFFERENTIAL/PLATELET
Basophils Absolute: 0.1 10*3/uL (ref 0.0–0.2)
Basos: 1 %
EOS (ABSOLUTE): 0.5 10*3/uL — ABNORMAL HIGH (ref 0.0–0.4)
Eos: 7 %
Hematocrit: 35.8 % (ref 34.0–46.6)
Hemoglobin: 11.6 g/dL (ref 11.1–15.9)
Immature Grans (Abs): 0 10*3/uL (ref 0.0–0.1)
Immature Granulocytes: 0 %
Lymphocytes Absolute: 1.9 10*3/uL (ref 0.7–3.1)
Lymphs: 28 %
MCH: 28.6 pg (ref 26.6–33.0)
MCHC: 32.4 g/dL (ref 31.5–35.7)
MCV: 88 fL (ref 79–97)
Monocytes Absolute: 0.5 10*3/uL (ref 0.1–0.9)
Monocytes: 7 %
Neutrophils Absolute: 4 10*3/uL (ref 1.4–7.0)
Neutrophils: 57 %
Platelets: 262 10*3/uL (ref 150–450)
RBC: 4.06 x10E6/uL (ref 3.77–5.28)
RDW: 12.4 % (ref 11.7–15.4)
WBC: 6.8 10*3/uL (ref 3.4–10.8)

## 2022-10-19 LAB — HEMOGLOBIN A1C
Est. average glucose Bld gHb Est-mCnc: 114 mg/dL
Hgb A1c MFr Bld: 5.6 % (ref 4.8–5.6)

## 2022-10-19 LAB — B12 AND FOLATE PANEL
Folate: >20 ng/mL (ref >3.0)
Vitamin B-12: 811 pg/mL (ref 232–1245)

## 2022-10-19 LAB — VITAMIN D 25 HYDROXY (VIT D DEFICIENCY, FRACTURES): Vit D, 25-Hydroxy: 48.1 ng/mL (ref 30.0–100.0)

## 2022-10-19 LAB — TSH+FREE T4
Free T4: 1.07 ng/dL (ref 0.82–1.77)
TSH: 2.14 u[IU]/mL (ref 0.450–4.500)

## 2022-10-25 ENCOUNTER — Ambulatory Visit: Payer: Medicare Other | Admitting: Internal Medicine

## 2022-10-28 DIAGNOSIS — Z1231 Encounter for screening mammogram for malignant neoplasm of breast: Secondary | ICD-10-CM | POA: Diagnosis not present

## 2022-10-28 LAB — HM MAMMOGRAPHY

## 2022-11-01 ENCOUNTER — Ambulatory Visit (INDEPENDENT_AMBULATORY_CARE_PROVIDER_SITE_OTHER): Payer: Medicare Other

## 2022-11-01 VITALS — BP 129/66 | HR 93 | Ht 69.0 in | Wt 180.0 lb

## 2022-11-01 DIAGNOSIS — Z0000 Encounter for general adult medical examination without abnormal findings: Secondary | ICD-10-CM

## 2022-11-01 NOTE — Patient Instructions (Signed)
  Kayla [name] , Thank you for taking time to come for your Medicare Wellness Visit. I appreciate your ongoing commitment to your health goals. Please review the following plan we discussed and let me know if I can assist you in the future.   These are the goals we discussed:  Goals      Patient Stated     To learn more on computer.        This is a list of the screening recommended for you and due dates:  Health Maintenance  Topic Date Due   DTaP/Tdap/Td vaccine (1 - Tdap) Never done   COVID-19 Vaccine (5 - 2023-24 season) 05/28/2022   Medicare Annual Wellness Visit  11/02/2023   Pneumonia Vaccine  Completed   Flu Shot  Completed   DEXA scan (bone density measurement)  Completed   Zoster (Shingles) Vaccine  Completed   HPV Vaccine  Aged Out

## 2022-11-01 NOTE — Progress Notes (Signed)
Subjective:   Kayla [name] is a 89 y.o. female who presents for Medicare Annual (Subsequent) preventive examination.  Review of Systems           Objective:    There were no vitals filed for this visit. There is no height or weight on file to calculate BMI.     08/02/2019    1:51 AM  Advanced Directives  Does Patient Have a Medical Advance Directive? No  Would patient like information on creating a medical advance directive? No - Patient declined    Current Medications (verified) Outpatient Encounter Medications as of 11/01/2022  Medication Sig   aspirin EC 81 MG tablet Take 1 tablet by mouth daily.   famotidine (PEPCID) 20 MG tablet Take 1 tablet (20 mg total) by mouth 2 (two) times daily.   Fish Oil-Cholecalciferol (FISH OIL + D3) 1000-1000 MG-UNIT CAPS Take 2 capsules by mouth daily.   Multiple Vitamin (MULTIVITAMIN) tablet Take 1 tablet by mouth daily.   valsartan-hydrochlorothiazide (DIOVAN-HCT) 160-12.5 MG tablet Take 1 tablet by mouth daily.   No facility-administered encounter medications on file as of 11/01/2022.    Allergies (verified) Crestor [rosuvastatin]   History: Past Medical History:  Diagnosis Date   GERD (gastroesophageal reflux disease)    Hypertension    No past surgical history on file. No family history on file. Social History   Socioeconomic History   Marital status: Widowed    Spouse name: Not on file   Number of children: Not on file   Years of education: Not on file   Highest education level: Not on file  Occupational History   Not on file  Tobacco Use   Smoking status: Never   Smokeless tobacco: Never  Vaping Use   Vaping Use: Never used  Substance and Sexual Activity   Alcohol use: Never   Drug use: Never   Sexual activity: Not on file  Other Topics Concern   Not on file  Social History Narrative   Not on file   Social Determinants of Health   Financial Resource Strain: Not on file  Food Insecurity: Not on file   Transportation Needs: Not on file  Physical Activity: Not on file  Stress: Not on file  Social Connections: Not on file    Tobacco Counseling Counseling given: Not Answered   Clinical Intake:    Kayla [name] , Thank you for taking time to come for your Medicare Wellness Visit. I appreciate your ongoing commitment to your health goals. Please review the following plan we discussed and let me know if I can assist you in the future.   These are the goals we discussed:  Goals      Patient Stated     To learn more on computer.        This is a list of the screening recommended for you and due dates:  Health Maintenance  Topic Date Due   DTaP/Tdap/Td vaccine (1 - Tdap) Never done   COVID-19 Vaccine (5 - 2023-24 season) 05/28/2022   Medicare Annual Wellness Visit  11/02/2023   Pneumonia Vaccine  Completed   Flu Shot  Completed   DEXA scan (bone density measurement)  Completed   Zoster (Shingles) Vaccine  Completed   HPV Vaccine  Aged Out              How often do you need to have someone help you when you read instructions, pamphlets, or other written materials from your doctor or  pharmacy?: (P) 1 - Never  Diabetic?no         Activities of Daily Living    10/29/2022    9:47 AM  In your present state of health, do you have any difficulty performing the following activities:  Hearing? 0  Vision? 0  Difficulty concentrating or making decisions? 0  Walking or climbing stairs? 0  Dressing or bathing? 0  Doing errands, shopping? 0  Preparing Food and eating ? N  Using the Toilet? N  In the past six months, have you accidently leaked urine? N  Do you have problems with loss of bowel control? N  Managing your Medications? N  Managing your Finances? N  Housekeeping or managing your Housekeeping? N    Patient Care Team: Johnette Abraham, MD as PCP - General (Internal Medicine)  Indicate any recent Medical Services you may have received from other than [facility]  providers in the past year (date may be approximate).     Assessment:   This is a routine wellness examination for [place].  Hearing/Vision screen No results found.  Dietary issues and exercise activities discussed:     Goals Addressed   None   Depression Screen    07/29/2022   10:24 AM  PHQ 2/9 Scores  PHQ - 2 Score 0    Fall Risk    10/29/2022    9:47 AM 07/29/2022   10:24 AM  [place] in the past year? 0 0  Number falls in past yr: 0 0  Injury with Fall? 0 0  Risk for fall due to :  No Fall Risks  Follow up  Falls evaluation completed    [place]:  Any stairs in or around the home? Yes  If so, are there any without handrails? No  Home free of loose throw rugs in walkways, pet beds, electrical cords, etc? Yes  Adequate lighting in your home to reduce risk of falls? Yes   ASSISTIVE DEVICES UTILIZED TO PREVENT FALLS:  Life alert? No  Use of a cane, walker or w/c? No  Grab bars in the bathroom? Yes  Shower chair or bench in shower? Yes  Elevated toilet seat or a handicapped toilet? Yes   TIMED UP AND GO:  Was the test performed? Yes .  Length of time to ambulate 10 feet: 8 sec.   Gait steady and fast without use of assistive device  Cognitive Function:        Immunizations Immunization History  Administered Date(s) Administered   Fluad Quad(high Dose 65+) 07/26/2022   Influenza-Unspecified 06/18/2021   Moderna Sars-Covid-2 Vaccination 11/14/2019, 12/12/2019, 08/16/2020, 07/03/2021   PNEUMOCOCCAL CONJUGATE-20 10/23/2021   Pneumococcal Conjugate-13 05/23/2017   Zoster Recombinat (Shingrix) 11/26/2021, 01/25/2022    TDAP status: Due, Education has been provided regarding the importance of this vaccine. Advised may receive this vaccine at local pharmacy or Health Dept. Aware to provide a copy of the vaccination record if obtained from local pharmacy or Health Dept. Verbalized acceptance and  understanding.  Flu Vaccine status: Up to date  Pneumococcal vaccine status: Up to date  Covid-19 vaccine status: Completed vaccines  Qualifies for Shingles Vaccine? Yes   Zostavax completed Yes   Shingrix Completed?: Yes  Screening Tests Health Maintenance  Topic Date Due   DTaP/Tdap/Td (1 - Tdap) Never done   COVID-19 Vaccine (5 - 2023-24 season) 05/28/2022   Medicare Annual Wellness (AWV)  11/02/2023   Pneumonia Vaccine 65+ Years  old  Completed   INFLUENZA VACCINE  Completed   DEXA SCAN  Completed   Zoster Vaccines- Shingrix  Completed   HPV VACCINES  Aged Out    Health Maintenance  Health Maintenance Due  Topic Date Due   DTaP/Tdap/Td (1 - Tdap) Never done   COVID-19 Vaccine (5 - 2023-24 season) 05/28/2022    Colorectal cancer screening: No longer required.   Mammogram status: No longer required due to age.  Bone Density status: Completed 11/11/21. Results reflect: Bone density results: OSTEOPOROSIS. Repeat every 2 years.  Lung Cancer Screening: (Low Dose CT Chest recommended if Age 11-80 years, 30 pack-year currently smoking OR have quit w/in 15years.) does not qualify.   Lung Cancer Screening Referral: no  Additional Screening:  Hepatitis C Screening: does qualify; Completed   Vision Screening: Recommended annual ophthalmology exams for early detection of glaucoma and other disorders of the eye. Is the patient up to date with their annual eye exam?  Yes  Who is the provider or what is the name of the office in which the patient attends annual eye exams? Dr Katy Fitch If pt is not established with a provider, would they like to be referred to a provider to establish care? No .   Dental Screening: Recommended annual dental exams for proper oral hygiene  Community Resource Referral / Chronic Care Management: CRR required this visit?  No   CCM required this visit?  No      Plan:     I have personally reviewed and noted the following in the patient's chart:    Medical and social history Use of alcohol, tobacco or illicit drugs  Current medications and supplements including opioid prescriptions. Patient is not currently taking opioid prescriptions. Functional ability and status Nutritional status Physical activity Advanced directives List of other physicians Hospitalizations, surgeries, and ER visits in previous 12 months Vitals Screenings to include cognitive, depression, and falls Referrals and appointments  In addition, I have reviewed and discussed with patient certain preventive protocols, quality metrics, and best practice recommendations. A written personalized care plan for preventive services as well as general preventive health recommendations were provided to patient.     Quentin Angst, [place]   11/01/2022

## 2022-11-08 ENCOUNTER — Other Ambulatory Visit: Payer: Self-pay | Admitting: Internal Medicine

## 2022-11-11 ENCOUNTER — Ambulatory Visit (INDEPENDENT_AMBULATORY_CARE_PROVIDER_SITE_OTHER): Payer: Medicare Other | Admitting: Internal Medicine

## 2022-11-11 ENCOUNTER — Encounter: Payer: Self-pay | Admitting: Internal Medicine

## 2022-11-11 VITALS — BP 134/74 | HR 83 | Ht 69.0 in | Wt 180.8 lb

## 2022-11-11 DIAGNOSIS — I10 Essential (primary) hypertension: Secondary | ICD-10-CM | POA: Diagnosis not present

## 2022-11-11 DIAGNOSIS — Z0001 Encounter for general adult medical examination with abnormal findings: Secondary | ICD-10-CM

## 2022-11-11 DIAGNOSIS — K219 Gastro-esophageal reflux disease without esophagitis: Secondary | ICD-10-CM | POA: Diagnosis not present

## 2022-11-11 DIAGNOSIS — M8580 Other specified disorders of bone density and structure, unspecified site: Secondary | ICD-10-CM | POA: Diagnosis not present

## 2022-11-11 DIAGNOSIS — E7841 Elevated Lipoprotein(a): Secondary | ICD-10-CM | POA: Diagnosis not present

## 2022-11-11 NOTE — Assessment & Plan Note (Signed)
BP 134/74 today.  She is currently prescribed valsartan-HCTZ 160-12.5 mg daily.  She regularly checks her blood pressure at home and home readings reflect adequate control. -No medication changes today

## 2022-11-11 NOTE — Assessment & Plan Note (Signed)
DEXA scan completed in February 2023, consistent with osteopenia.  Vitamin D and calcium levels are adequate. -Vitamin D supplementation

## 2022-11-11 NOTE — Assessment & Plan Note (Signed)
Presenting today for her annual exam.  Previous records and labs been reviewed. -Labs updated last month -I recommended that she receive outstanding Tdap vaccination at her pharmacy -Additional preventative care items are up-to-date -We will tentatively plan for follow-up in 6 months

## 2022-11-11 NOTE — Assessment & Plan Note (Signed)
Symptoms remain well-controlled with Pepcid.  No changes today.

## 2022-11-11 NOTE — Assessment & Plan Note (Signed)
Lipid panel updated last month.  Total cholesterol 241 and LDL 165.  She is not currently statin therapy.  Previously prescribed rosuvastatin but experienced adverse side effects. -No indication to start statin therapy today given low ASCVD risk and age. -She will continue to focus on dietary changes aimed at lowering her cholesterol

## 2022-11-11 NOTE — Patient Instructions (Signed)
It was a pleasure to see you today.  Thank you for giving [place] the opportunity to be involved in your care.  Below is a brief recap of your visit and next steps.  We will plan to see you again in 6 months.  Summary We completed your annual exam today Plan for follow up in 6 months

## 2022-11-11 NOTE — Progress Notes (Addendum)
Complete physical exam  Patient: Kayla [name]   DOB: [birth year]   89 y.o. Female  MRN: [id number]  Subjective:    Chief Complaint  Patient presents with   Annual Exam   Kayla [name] is a 89 y.o. female who presents today for a complete physical exam. She reports consuming a general diet. Gym/ health club routine includes light weights, stationary bike, and treadmill. She generally feels well. She reports sleeping fairly well. She does not have additional problems to discuss today.    Most recent fall risk assessment:    11/11/2022    8:16 AM  Fall Risk   Falls in the past year? 0  Number falls in past yr: 0  Injury with Fall? 0  Risk for fall due to : No Fall Risks  Follow up Falls evaluation completed     Most recent depression screenings:    11/11/2022    8:16 AM 11/01/2022    9:34 AM  PHQ 2/9 Scores  PHQ - 2 Score 0 0  PHQ- 9 Score 0     Vision:Within last year and Dental: No current dental problems and Receives regular dental care  Past Medical History:  Diagnosis Date   GERD (gastroesophageal reflux disease)    Hypertension    History reviewed. No pertinent surgical history. Social History   Tobacco Use   Smoking status: Never   Smokeless tobacco: Never  Vaping Use   Vaping Use: Never used  Substance Use Topics   Alcohol use: Never   Drug use: Never   History reviewed. No pertinent family history. Allergies  Allergen Reactions   Crestor [Rosuvastatin]     Patient Care Team: Johnette Abraham, MD as PCP - General (Internal Medicine)   Outpatient Medications Prior to Visit  Medication Sig   aspirin EC 81 MG tablet Take 1 tablet by mouth daily.   famotidine (PEPCID) 20 MG tablet Take 1 tablet (20 mg total) by mouth 2 (two) times daily.   Fish Oil-Cholecalciferol (FISH OIL + D3) 1000-1000 MG-UNIT CAPS Take 2 capsules by mouth daily.   Multiple Vitamin (MULTIVITAMIN) tablet Take 1 tablet by mouth daily.   valsartan-hydrochlorothiazide  (DIOVAN-HCT) 160-12.5 MG tablet TAKE 1 TABLET BY MOUTH DAILY   No facility-administered medications prior to visit.   Review of Systems  Constitutional:  Negative for chills and fever.  HENT:  Negative for sore throat.   Respiratory:  Negative for cough and shortness of breath.   Cardiovascular:  Negative for chest pain, palpitations and leg swelling.  Gastrointestinal:  Negative for abdominal pain, blood in stool, constipation, diarrhea, nausea and vomiting.  Genitourinary:  Negative for dysuria and hematuria.  Musculoskeletal:  Negative for myalgias.  Skin:  Negative for itching and rash.  Neurological:  Negative for dizziness and headaches.  Psychiatric/Behavioral:  Negative for depression and suicidal ideas.       Objective:     BP 134/74   Pulse 83   Ht 5' 9"$  (1.753 m)   Wt 180 lb 12.8 oz (82 kg)   SpO2 95%   BMI 26.70 kg/m  BP Readings from Last 3 Encounters:  11/11/22 134/74  11/01/22 129/66  07/29/22 (!) 143/62   Physical Exam Vitals reviewed.  Constitutional:      General: She is not in acute distress.    Appearance: Normal appearance. She is not toxic-appearing.  HENT:     Head: Normocephalic and atraumatic.     Right Ear: External ear normal.  Left Ear: External ear normal.     Nose: Nose normal. No congestion or rhinorrhea.     Mouth/Throat:     Mouth: Mucous membranes are moist.     Pharynx: Oropharynx is clear. No oropharyngeal exudate or posterior oropharyngeal erythema.  Eyes:     General: No scleral icterus.    Extraocular Movements: Extraocular movements intact.     Conjunctiva/sclera: Conjunctivae normal.     Pupils: Pupils are equal, round, and reactive to light.  Cardiovascular:     Rate and Rhythm: Normal rate and regular rhythm.     Pulses: Normal pulses.     Heart sounds: Murmur heard.     No friction rub. No gallop.  Pulmonary:     Effort: Pulmonary effort is normal.     Breath sounds: Normal breath sounds. No wheezing, rhonchi or  rales.  Abdominal:     General: Abdomen is flat. Bowel sounds are normal. There is no distension.     Palpations: Abdomen is soft.     Tenderness: There is no abdominal tenderness.  Musculoskeletal:        General: No swelling. Normal range of motion.     Cervical back: Normal range of motion.     Right lower leg: No edema.     Left lower leg: No edema.  Lymphadenopathy:     Cervical: No cervical adenopathy.  Skin:    General: Skin is warm and dry.     Capillary Refill: Capillary refill takes less than 2 seconds.     Coloration: Skin is not jaundiced.  Neurological:     General: No focal deficit present.     Mental Status: She is alert and oriented to person, place, and time.  Psychiatric:        Mood and Affect: Mood normal.        Behavior: Behavior normal.   Last CBC Lab Results  Component Value Date   WBC 6.8 10/18/2022   HGB 11.6 10/18/2022   HCT 35.8 10/18/2022   MCV 88 10/18/2022   MCH 28.6 10/18/2022   RDW 12.4 10/18/2022   PLT 262 [id number]   Last metabolic panel Lab Results  Component Value Date   GLUCOSE 97 10/18/2022   NA 142 10/18/2022   K 4.5 10/18/2022   CL 102 10/18/2022   CO2 25 10/18/2022   BUN 23 10/18/2022   CREATININE 0.99 10/18/2022   EGFR 56 (L) 10/18/2022   CALCIUM 9.3 10/18/2022   PROT 6.4 10/18/2022   ALBUMIN 3.9 10/18/2022   LABGLOB 2.5 10/18/2022   AGRATIO 1.6 10/18/2022   BILITOT 0.3 10/18/2022   ALKPHOS 87 10/18/2022   AST 17 10/18/2022   ALT 11 10/18/2022   ANIONGAP 7 08/02/2019   Last lipids Lab Results  Component Value Date   CHOL 241 (H) 10/18/2022   HDL 56 10/18/2022   LDLCALC 165 (H) 10/18/2022   TRIG 113 10/18/2022   CHOLHDL 4.3 10/18/2022   Last hemoglobin A1c Lab Results  Component Value Date   HGBA1C 5.6 10/18/2022   Last thyroid functions Lab Results  Component Value Date   TSH 2.140 10/18/2022   Last vitamin D Lab Results  Component Value Date   VD25OH 48.1 10/18/2022   Last vitamin B12 and  Folate Lab Results  Component Value Date   VITAMINB12 811 10/18/2022   FOLATE >20.0 10/18/2022      Assessment & Plan:    Routine Health Maintenance and Physical Exam  Immunization History  Administered Date(s)  Administered   Fluad Quad(high Dose 65+) 07/26/2022   Influenza-Unspecified 06/18/2021   Moderna Sars-Covid-2 Vaccination 11/14/2019, 12/12/2019, 08/16/2020, 07/03/2021   PNEUMOCOCCAL CONJUGATE-20 10/23/2021   Pneumococcal Conjugate-13 05/23/2017   Zoster Recombinat (Shingrix) 11/26/2021, 01/25/2022    Health Maintenance  Topic Date Due   DTaP/Tdap/Td (1 - Tdap) Never done   Medicare Annual Wellness (AWV)  11/02/2023   Pneumonia Vaccine 33+ Years old  Completed   INFLUENZA VACCINE  Completed   DEXA SCAN  Completed   Zoster Vaccines- Shingrix  Completed   HPV VACCINES  Aged Out   COVID-19 Vaccine  Discontinued    Discussed health benefits of physical activity, and encouraged her to engage in regular exercise appropriate for her age and condition.  Problem List Items Addressed This Visit       Hypertension    BP 134/74 today.  She is currently prescribed valsartan-HCTZ 160-12.5 mg daily.  She regularly checks her blood pressure at home and home readings reflect adequate control. -No medication changes today      GERD (gastroesophageal reflux disease)    Symptoms remain well-controlled with Pepcid.  No changes today.      Osteopenia    DEXA scan completed in February 2023, consistent with osteopenia.  Vitamin D and calcium levels are adequate. -Vitamin D supplementation      Hyperlipidemia    Lipid panel updated last month.  Total cholesterol 241 and LDL 165.  She is not currently statin therapy.  Previously prescribed rosuvastatin but experienced adverse side effects. -No indication to start statin therapy today given low ASCVD risk and age. -She will continue to focus on dietary changes aimed at lowering her cholesterol      Encounter for well adult  exam with abnormal findings - Primary    Presenting today for her annual exam.  Previous records and labs been reviewed. -Labs updated last month -I recommended that she receive outstanding Tdap vaccination at her pharmacy -Additional preventative care items are up-to-date -We will tentatively plan for follow-up in 6 months      Return in about 6 months (around 05/12/2023).  Johnette Abraham, MD

## 2022-11-26 DIAGNOSIS — H02132 Senile ectropion of right lower eyelid: Secondary | ICD-10-CM | POA: Diagnosis not present

## 2022-11-26 DIAGNOSIS — Z961 Presence of intraocular lens: Secondary | ICD-10-CM | POA: Diagnosis not present

## 2022-11-26 DIAGNOSIS — H00021 Hordeolum internum right upper eyelid: Secondary | ICD-10-CM | POA: Diagnosis not present

## 2022-11-26 DIAGNOSIS — H02135 Senile ectropion of left lower eyelid: Secondary | ICD-10-CM | POA: Diagnosis not present

## 2022-11-26 DIAGNOSIS — H43813 Vitreous degeneration, bilateral: Secondary | ICD-10-CM | POA: Diagnosis not present

## 2022-11-26 DIAGNOSIS — D3131 Benign neoplasm of right choroid: Secondary | ICD-10-CM | POA: Diagnosis not present

## 2022-12-01 DIAGNOSIS — H00021 Hordeolum internum right upper eyelid: Secondary | ICD-10-CM | POA: Diagnosis not present

## 2022-12-05 ENCOUNTER — Other Ambulatory Visit: Payer: Self-pay | Admitting: Internal Medicine

## 2022-12-28 DIAGNOSIS — H00021 Hordeolum internum right upper eyelid: Secondary | ICD-10-CM | POA: Diagnosis not present

## 2022-12-29 DIAGNOSIS — L821 Other seborrheic keratosis: Secondary | ICD-10-CM | POA: Diagnosis not present

## 2022-12-29 DIAGNOSIS — L720 Epidermal cyst: Secondary | ICD-10-CM | POA: Diagnosis not present

## 2022-12-29 DIAGNOSIS — L814 Other melanin hyperpigmentation: Secondary | ICD-10-CM | POA: Diagnosis not present

## 2022-12-29 DIAGNOSIS — L570 Actinic keratosis: Secondary | ICD-10-CM | POA: Diagnosis not present

## 2022-12-29 DIAGNOSIS — I788 Other diseases of capillaries: Secondary | ICD-10-CM | POA: Diagnosis not present

## 2023-01-13 DIAGNOSIS — L720 Epidermal cyst: Secondary | ICD-10-CM | POA: Diagnosis not present

## 2023-01-14 ENCOUNTER — Telehealth: Payer: Self-pay | Admitting: Internal Medicine

## 2023-01-14 NOTE — Telephone Encounter (Signed)
Patient came into the office to speak to office manager.  Patient needs to speak to manager about DOS 02.15.2024 her billing was sent incorrectly, she has trad. medicare red white blue and does not cover CPE should have been OV only. Looks possible coded incorrectly. Please contact patient at [phone]

## 2023-01-14 NOTE — Telephone Encounter (Signed)
Spoke with patient and explained EOB she received. Patient was agreeable to my action plan to correct.  Identified incorrect primary Dx code on claim. Will request for [employer] to resubmit with problem Dx only.  . Dixon - please remove Z00.01 from encounter. Thank you

## 2023-02-02 DIAGNOSIS — D3131 Benign neoplasm of right choroid: Secondary | ICD-10-CM | POA: Diagnosis not present

## 2023-02-02 DIAGNOSIS — H43813 Vitreous degeneration, bilateral: Secondary | ICD-10-CM | POA: Diagnosis not present

## 2023-05-12 ENCOUNTER — Telehealth: Payer: Self-pay | Admitting: Internal Medicine

## 2023-05-12 NOTE — Telephone Encounter (Signed)
Patient called asked for nurse to return her call she has appointment next week been on vacation and just came back, there is no orders for bloodwork, does Dr Durwin Nora need for her to do any bloodwork? Patient call back # [phone] and can leave a detailed message if blood work is needed or not before patient appointment.

## 2023-05-12 NOTE — Telephone Encounter (Signed)
Patient advised.

## 2023-05-13 ENCOUNTER — Ambulatory Visit: Payer: Medicare Other | Admitting: Internal Medicine

## 2023-05-19 ENCOUNTER — Ambulatory Visit (INDEPENDENT_AMBULATORY_CARE_PROVIDER_SITE_OTHER): Payer: Medicare Other | Admitting: Internal Medicine

## 2023-05-19 ENCOUNTER — Encounter: Payer: Self-pay | Admitting: Internal Medicine

## 2023-05-19 VITALS — BP 111/54 | HR 75 | Ht 69.0 in | Wt 169.4 lb

## 2023-05-19 DIAGNOSIS — K219 Gastro-esophageal reflux disease without esophagitis: Secondary | ICD-10-CM | POA: Diagnosis not present

## 2023-05-19 DIAGNOSIS — N1831 Chronic kidney disease, stage 3a: Secondary | ICD-10-CM | POA: Diagnosis not present

## 2023-05-19 DIAGNOSIS — I10 Essential (primary) hypertension: Secondary | ICD-10-CM

## 2023-05-19 DIAGNOSIS — M8580 Other specified disorders of bone density and structure, unspecified site: Secondary | ICD-10-CM | POA: Diagnosis not present

## 2023-05-19 MED ORDER — VALSARTAN 160 MG PO TABS: 160.0000 mg | ORAL_TABLET | Freq: Every day | ORAL | 3 refills | Status: DC

## 2023-05-19 NOTE — Assessment & Plan Note (Signed)
She is currently prescribed valsartan-HCTZ 160-12.5 mg daily for treatment of hypertension.  BP today is 111/54.  She regularly checks her blood pressure at home and brings a log for review today.  Systolic pressures are consistently around 100 mmHg and diastolic pressures range 40-50 mmHg.  She has intentionally lost 11 pounds since her last appointment.  Denies symptoms of orthostasis, but is concerned that her blood pressure is running low. -Through shared decision making, valsartan-HCTZ has been discontinued today.  I have prescribed valsartan 160 mg daily.  She will continue to check her blood pressure regularly and notify our office if readings remain borderline low.

## 2023-05-19 NOTE — Assessment & Plan Note (Signed)
Previous labs are consistent with CKD 3A.  She is currently on ARB.  We will repeat labs at follow-up in 6 months.

## 2023-05-19 NOTE — Patient Instructions (Signed)
It was a pleasure to see you today.  Thank you for giving [place] the opportunity to be involved in your care.  Below is a brief recap of your visit and next steps.  We will plan to see you again in 6 months.  Summary Discontinue valsartan-hydrochlorothiazide and start valsartan 160 mg daily We will plan for follow up in 6 months and repeat labs at that time.

## 2023-05-19 NOTE — Progress Notes (Signed)
Established Patient Office Visit  Subjective   Patient ID: Kayla [name], female    DOB: 07-24-1934  Age: 89 y.o. MRN: [id number]  Chief Complaint  Patient presents with   Hyperlipidemia    Follow up   Kayla [name] returns to care today for routine follow-up.  She was last evaluated by me on 2/15 for her annual exam.  No medication changes were made at that time and 3-month follow-up was arranged.  There have been no acute interval events.  Kayla [name] reports feeling well today.  Her acute concern is noticing that her blood pressure readings have run borderline low recently.  She has made lifestyle changes aimed at weight loss recently and has lost 11 pounds since her last appointment.  She checks her blood pressure regularly and brings a log showing systolic readings consistently around 100 and diastolic pressures consistently 40-50 mmHg.  She is currently prescribed valsartan-HCTZ 160-12.5 mg daily.  Past Medical History:  Diagnosis Date   GERD (gastroesophageal reflux disease)    Hypertension    History reviewed. No pertinent surgical history. Social History   Tobacco Use   Smoking status: Never   Smokeless tobacco: Never  Vaping Use   Vaping status: Never Used  Substance Use Topics   Alcohol use: Never   Drug use: Never   History reviewed. No pertinent family history. Allergies  Allergen Reactions   Crestor [Rosuvastatin]    Review of Systems  Constitutional:  Negative for chills and fever.  HENT:  Negative for sore throat.   Respiratory:  Negative for cough and shortness of breath.   Cardiovascular:  Negative for chest pain, palpitations and leg swelling.  Gastrointestinal:  Negative for abdominal pain, blood in stool, constipation, diarrhea, nausea and vomiting.  Genitourinary:  Negative for dysuria and hematuria.  Musculoskeletal:  Negative for myalgias.  Skin:  Negative for itching and rash.  Neurological:  Negative for dizziness and headaches.   Psychiatric/Behavioral:  Negative for depression and suicidal ideas.      Objective:     BP (!) 111/54   Pulse 75   Ht 5\' 9"  (1.753 m)   Wt 169 lb 6.4 oz (76.8 kg)   SpO2 96%   BMI 25.02 kg/m  BP Readings from Last 3 Encounters:  05/19/23 (!) 111/54  11/11/22 134/74  11/01/22 129/66   Physical Exam Vitals reviewed.  Constitutional:      General: She is not in acute distress.    Appearance: Normal appearance. She is not toxic-appearing.  HENT:     Head: Normocephalic and atraumatic.     Right Ear: External ear normal.     Left Ear: External ear normal.     Nose: Nose normal. No congestion or rhinorrhea.     Mouth/Throat:     Mouth: Mucous membranes are moist.     Pharynx: Oropharynx is clear. No oropharyngeal exudate or posterior oropharyngeal erythema.  Eyes:     General: No scleral icterus.    Extraocular Movements: Extraocular movements intact.     Conjunctiva/sclera: Conjunctivae normal.     Pupils: Pupils are equal, round, and reactive to light.  Cardiovascular:     Rate and Rhythm: Normal rate and regular rhythm.     Pulses: Normal pulses.     Heart sounds: Murmur heard.     No friction rub. No gallop.  Pulmonary:     Effort: Pulmonary effort is normal.     Breath sounds: Normal breath sounds. No wheezing, rhonchi or rales.  Abdominal:     General: Abdomen is flat. Bowel sounds are normal. There is no distension.     Palpations: Abdomen is soft.     Tenderness: There is no abdominal tenderness.  Musculoskeletal:        General: No swelling. Normal range of motion.     Cervical back: Normal range of motion.     Right lower leg: No edema.     Left lower leg: No edema.  Lymphadenopathy:     Cervical: No cervical adenopathy.  Skin:    General: Skin is warm and dry.     Capillary Refill: Capillary refill takes less than 2 seconds.     Coloration: Skin is not jaundiced.  Neurological:     General: No focal deficit present.     Mental Status: She is alert  and oriented to person, place, and time.  Psychiatric:        Mood and Affect: Mood normal.        Behavior: Behavior normal.   Last CBC Lab Results  Component Value Date   WBC 6.8 10/18/2022   HGB 11.6 10/18/2022   HCT 35.8 10/18/2022   MCV 88 10/18/2022   MCH 28.6 10/18/2022   RDW 12.4 10/18/2022   PLT 262 10/18/2022   Last metabolic panel Lab Results  Component Value Date   GLUCOSE 97 10/18/2022   NA 142 10/18/2022   K 4.5 10/18/2022   CL 102 10/18/2022   CO2 25 10/18/2022   BUN 23 10/18/2022   CREATININE 0.99 10/18/2022   EGFR 56 (L) 10/18/2022   CALCIUM 9.3 10/18/2022   PROT 6.4 10/18/2022   ALBUMIN 3.9 10/18/2022   LABGLOB 2.5 10/18/2022   AGRATIO 1.6 10/18/2022   BILITOT 0.3 10/18/2022   ALKPHOS 87 10/18/2022   AST 17 10/18/2022   ALT 11 10/18/2022   ANIONGAP 7 08/02/2019   Last lipids Lab Results  Component Value Date   CHOL 241 (H) 10/18/2022   HDL 56 10/18/2022   LDLCALC 165 (H) 10/18/2022   TRIG 113 10/18/2022   CHOLHDL 4.3 10/18/2022   Last hemoglobin A1c Lab Results  Component Value Date   HGBA1C 5.6 10/18/2022   Last thyroid functions Lab Results  Component Value Date   TSH 2.140 10/18/2022   Last vitamin D Lab Results  Component Value Date   VD25OH 48.1 10/18/2022   Last vitamin B12 and Folate Lab Results  Component Value Date   VITAMINB12 811 10/18/2022   FOLATE >20.0 10/18/2022     Assessment & Plan:   Problem List Items Addressed This Visit       Hypertension - Primary    She is currently prescribed valsartan-HCTZ 160-12.5 mg daily for treatment of hypertension.  BP today is 111/54.  She regularly checks her blood pressure at home and brings a log for review today.  Systolic pressures are consistently around 100 mmHg and diastolic pressures range 40-50 mmHg.  She has intentionally lost 11 pounds since her last appointment.  Denies symptoms of orthostasis, but is concerned that her blood pressure is running low. -Through  shared decision making, valsartan-HCTZ has been discontinued today.  I have prescribed valsartan 160 mg daily.  She will continue to check her blood pressure regularly and notify our office if readings remain borderline low.      CKD stage 3a, GFR 45-59 ml/min (HCC)    Previous labs are consistent with CKD 3A.  She is currently on ARB.  We will repeat labs at  follow-up in 6 months.      Return in about 6 months (around 11/19/2023) for CPE.   Billie Lade, MD

## 2023-06-06 ENCOUNTER — Ambulatory Visit
Admission: EM | Admit: 2023-06-06 | Discharge: 2023-06-06 | Disposition: A | Discharge status: Home / Self Care | Payer: Medicare Other

## 2023-06-08 ENCOUNTER — Encounter: Payer: Self-pay | Admitting: Internal Medicine

## 2023-06-08 ENCOUNTER — Ambulatory Visit (INDEPENDENT_AMBULATORY_CARE_PROVIDER_SITE_OTHER): Payer: Medicare Other | Admitting: Internal Medicine

## 2023-06-08 ENCOUNTER — Ambulatory Visit (INDEPENDENT_AMBULATORY_CARE_PROVIDER_SITE_OTHER): Payer: Medicare Other

## 2023-06-08 ENCOUNTER — Ambulatory Visit (HOSPITAL_COMMUNITY)
Admission: RE | Admit: 2023-06-08 | Discharge: 2023-06-08 | Disposition: A | Discharge status: Home / Self Care | Payer: Medicare Other | Source: Ambulatory Visit | Attending: Internal Medicine | Admitting: Internal Medicine

## 2023-06-08 VITALS — BP 127/62 | HR 86 | Resp 16 | Ht 69.0 in | Wt 170.0 lb

## 2023-06-08 VITALS — BP 122/86 | HR 79 | Wt 225.1 lb

## 2023-06-08 DIAGNOSIS — Z23 Encounter for immunization: Secondary | ICD-10-CM | POA: Diagnosis not present

## 2023-06-08 DIAGNOSIS — M7731 Calcaneal spur, right foot: Secondary | ICD-10-CM | POA: Diagnosis not present

## 2023-06-08 DIAGNOSIS — S93401A Sprain of unspecified ligament of right ankle, initial encounter: Secondary | ICD-10-CM

## 2023-06-08 DIAGNOSIS — M25571 Pain in right ankle and joints of right foot: Secondary | ICD-10-CM | POA: Diagnosis not present

## 2023-06-08 DIAGNOSIS — M79671 Pain in right foot: Secondary | ICD-10-CM | POA: Diagnosis not present

## 2023-06-08 DIAGNOSIS — M7989 Other specified soft tissue disorders: Secondary | ICD-10-CM | POA: Insufficient documentation

## 2023-06-08 DIAGNOSIS — M19071 Primary osteoarthritis, right ankle and foot: Secondary | ICD-10-CM | POA: Diagnosis not present

## 2023-06-08 NOTE — Assessment & Plan Note (Addendum)
Right ankle swelling likely due to ankle sprain from walking on uneven surface in the yard Leg elevation Advised to apply ice and ankle sleeve as tolerated Check x-ray of right ankle Tylenol as needed for pain, she prefers to avoid any prescription medicine for now

## 2023-06-08 NOTE — Progress Notes (Signed)
Acute Office Visit  Subjective:    Patient ID: Kayla [name], female    DOB: August 18, 1934, 89 y.o.   MRN: [id number]  Chief Complaint  Patient presents with   Foot Pain    Foot has been swelling for 2 weeks and pain across the top and by night time she can barely walk on it. Started with pain along the lateral side of foot. Doesn't want to let it ago incase she needs treatment. Thinks she needs an xray     HPI Patient is in today for complaint of left ankle and foot swelling since 05/25/23.  She worked in her yard on that day and is unclear if she had twisted her left foot while walking.  She has tried leg elevation and ankle sleeve with some relief.  She has pain upon standing and taking first few steps.  She has pain on the lateral side of the foot upon walking.  She prefers to avoid any medicine for now.  Past Medical History:  Diagnosis Date   GERD (gastroesophageal reflux disease)    Hypertension     History reviewed. No pertinent surgical history.  History reviewed. No pertinent family history.  Social History   Socioeconomic History   Marital status: Widowed    Spouse name: Not on file   Number of children: Not on file   Years of education: Not on file   Highest education level: Not on file  Occupational History   Not on file  Tobacco Use   Smoking status: Never   Smokeless tobacco: Never  Vaping Use   Vaping status: Never Used  Substance and Sexual Activity   Alcohol use: Never   Drug use: Never   Sexual activity: Not on file  Other Topics Concern   Not on file  Social History Narrative   Not on file   Social Determinants of Health   Financial Resource Strain: Low Risk  (11/01/2022)   Overall Financial Resource Strain (CARDIA)    Difficulty of Paying Living Expenses: Not hard at all  Food Insecurity: No Food Insecurity (11/01/2022)   Hunger Vital Sign    Worried About Running Out of Food in the Last Year: Never true    Ran Out of Food in the Last Year:  Never true  Transportation Needs: No Transportation Needs (11/01/2022)   PRAPARE - Administrator, Civil Service (Medical): No    Lack of Transportation (Non-Medical): No  Physical Activity: Sufficiently Active (11/01/2022)   Exercise Vital Sign    Days of Exercise per Week: 3 days    Minutes of Exercise per Session: 60 min  Stress: No Stress Concern Present (11/01/2022)   [employer] of Occupational Health - Occupational Stress Questionnaire    Feeling of Stress : Not at all  Social Connections: Moderately Isolated (11/01/2022)   Social Connection and Isolation Panel [NHANES]    Frequency of Communication with Friends and Family: More than three times a week    Frequency of Social Gatherings with Friends and Family: More than three times a week    Attends Religious Services: More than 4 times per year    Active Member of [employer] or Organizations: No    Attends Banker Meetings: Never    Marital Status: Widowed  Intimate Partner Violence: Not At Risk (11/01/2022)   Humiliation, Afraid, Rape, and Kick questionnaire    Fear of Current or Ex-Partner: No    Emotionally Abused: No    Physically  Abused: No    Sexually Abused: No    Outpatient Medications Prior to Visit  Medication Sig Dispense Refill   aspirin EC 81 MG tablet Take 1 tablet by mouth daily.     famotidine (PEPCID) 20 MG tablet TAKE 1 TABLET BY MOUTH TWICE  DAILY 180 tablet 3   Fish Oil-Cholecalciferol (FISH OIL + D3) 1000-1000 MG-UNIT CAPS Take 2 capsules by mouth daily.     Multiple Vitamin (MULTIVITAMIN) tablet Take 1 tablet by mouth daily.     valsartan (DIOVAN) 160 MG tablet Take 1 tablet (160 mg total) by mouth daily. 90 tablet 3   No facility-administered medications prior to visit.    Allergies  Allergen Reactions   Crestor [Rosuvastatin]     Review of Systems  Constitutional:  Negative for chills and fever.  Respiratory:  Negative for cough and shortness of breath.   Cardiovascular:   Negative for chest pain and palpitations.  Gastrointestinal:  Negative for nausea and vomiting.  Musculoskeletal:  Negative for neck pain and neck stiffness.       Right foot pain and swelling  Skin:  Negative for rash.  Neurological:  Negative for dizziness and weakness.  Psychiatric/Behavioral:  Negative for agitation and behavioral problems.        Objective:    Physical Exam Vitals reviewed.  Constitutional:      General: She is not in acute distress.    Appearance: She is not diaphoretic.  Eyes:     General: No scleral icterus.    Extraocular Movements: Extraocular movements intact.  Cardiovascular:     Rate and Rhythm: Normal rate and regular rhythm.     Heart sounds: Murmur (Systolic) heard.  Pulmonary:     Breath sounds: Normal breath sounds. No wheezing or rales.  Musculoskeletal:     Cervical back: Neck supple. No tenderness.     Right lower leg: Edema present.     Left lower leg: No edema.     Right ankle: Swelling present. Tenderness present. Decreased range of motion.     Right foot: Decreased range of motion.  Skin:    General: Skin is warm.     Findings: No rash.  Neurological:     General: No focal deficit present.     Mental Status: She is alert and oriented to person, place, and time.  Psychiatric:        Mood and Affect: Mood normal.        Behavior: Behavior normal.     BP 127/62   Pulse 86   Resp 16   Ht 5\' 9"  (1.753 m)   Wt 170 lb (77.1 kg)   SpO2 97%   BMI 25.10 kg/m  Wt Readings from Last 3 Encounters:  06/08/23 170 lb (77.1 kg)  06/08/23 225 lb 1.9 oz (102.1 kg)  05/19/23 169 lb 6.4 oz (76.8 kg)        Assessment & Plan:   Problem List Items Addressed This Visit       Musculoskeletal and Integument   Sprain of right ankle - Primary    Right ankle swelling likely due to ankle sprain from walking on uneven surface in the yard Leg elevation Advised to apply ice and ankle sleeve as tolerated Check x-ray of right ankle Tylenol  as needed for pain, she prefers to avoid any prescription medicine for now      Relevant Orders   DG Ankle Complete Right     Other   Swelling of  right foot    Likely from twisting of foot Check x-ray of right foot Leg elevation and apply ice      Relevant Orders   DG Foot Complete Right     No orders of the defined types were placed in this encounter.    Anabel Halon, MD

## 2023-06-08 NOTE — Assessment & Plan Note (Signed)
Likely from twisting of foot Check x-ray of right foot Leg elevation and apply ice

## 2023-06-08 NOTE — Patient Instructions (Signed)
Please perform leg elevation and apply ice for foot swelling.  Okay to apply ankle sleeve.

## 2023-10-12 ENCOUNTER — Other Ambulatory Visit: Payer: Self-pay | Admitting: Internal Medicine

## 2023-10-31 ENCOUNTER — Encounter: Payer: Self-pay | Admitting: Internal Medicine

## 2023-10-31 ENCOUNTER — Ambulatory Visit (INDEPENDENT_AMBULATORY_CARE_PROVIDER_SITE_OTHER): Payer: Medicare Other | Admitting: Internal Medicine

## 2023-10-31 VITALS — BP 133/63 | HR 88 | Ht 69.0 in | Wt 155.6 lb

## 2023-10-31 DIAGNOSIS — K219 Gastro-esophageal reflux disease without esophagitis: Secondary | ICD-10-CM | POA: Diagnosis not present

## 2023-10-31 DIAGNOSIS — R1903 Right lower quadrant abdominal swelling, mass and lump: Secondary | ICD-10-CM | POA: Diagnosis not present

## 2023-10-31 DIAGNOSIS — R1031 Right lower quadrant pain: Secondary | ICD-10-CM | POA: Diagnosis not present

## 2023-10-31 DIAGNOSIS — M8580 Other specified disorders of bone density and structure, unspecified site: Secondary | ICD-10-CM | POA: Diagnosis not present

## 2023-10-31 DIAGNOSIS — N1831 Chronic kidney disease, stage 3a: Secondary | ICD-10-CM | POA: Diagnosis not present

## 2023-10-31 DIAGNOSIS — I10 Essential (primary) hypertension: Secondary | ICD-10-CM | POA: Diagnosis not present

## 2023-10-31 NOTE — Patient Instructions (Signed)
It was a pleasure to see you today.  Thank you for giving [place] the opportunity to be involved in your care.  Below is a brief recap of your visit and next steps.  We will plan to see you again in 4 weeks.  Summary CT abdomen pelvis ordered today Complete previously ordered labs Follow up in 4 weeks

## 2023-10-31 NOTE — Progress Notes (Signed)
Acute Office Visit  Subjective:     Patient ID: Kayla [name], female    DOB: 05/04/1934, 89 y.o.   MRN: [id number]  Chief Complaint  Patient presents with   Pain    Pt reports pains and soreness, has concerns about all this, having abdominal pain on right lower quadrant.    Kayla [name] presents today for an acute visit endorsing an almost 59-month history of right lower quadrant abdominal pain.  She was last evaluated by me in August 2024.  Valsartan-HCTZ was discontinued in favor of valsartan 160 mg daily in the setting of orthostasis.  35-month follow-up was arranged for annual physical.  Acute visit at [facility] on 9/11 for a right ankle sprain.  There have otherwise been no acute interval events.  Today Kayla [name] states that she experienced multiple episodes of nausea with vomiting after eating chili on 12/15.  Right lower quadrant abdominal pain began around the same time.  She initially attributed her discomfort to a muscle strain after "violent" episodes of emesis and believed that the discomfort would gradually improve.  She currently experiences discomfort in the right lower quadrant of her abdomen with walking, bending forward at the waist, or turning over in bed.  The pain has improved in severity, but is still present she planned to wait until her follow-up appointment next month, but due to its persistence decided to seek care today.  Pain is not present at rest, but is noticeable with activity.  She has not noted any change in her bowel habits and denies melena/hematochezia.  No recurrent episodes of emesis.  Of note, she continues to intentionally lose weight.  Her weight today is 155 pounds, which is down from 170 pounds since her appointment in September.  Labs completed earlier today are pending.  Review of Systems  Constitutional:  Positive for weight loss (down 15 lbs unintentionally since September 2024).  Gastrointestinal:  Positive for abdominal pain (RLQ).      Objective:     BP 133/63   Pulse 88   Ht 5\' 9"  (1.753 m)   Wt 155 lb 9.6 oz (70.6 kg)   SpO2 99%   BMI 22.98 kg/m   Physical Exam Vitals reviewed.  Constitutional:      General: She is not in acute distress.    Appearance: Normal appearance. She is not toxic-appearing.  HENT:     Head: Normocephalic and atraumatic.     Right Ear: External ear normal.     Left Ear: External ear normal.     Nose: Nose normal. No congestion or rhinorrhea.     Mouth/Throat:     Mouth: Mucous membranes are moist.     Pharynx: Oropharynx is clear. No oropharyngeal exudate or posterior oropharyngeal erythema.  Eyes:     General: No scleral icterus.    Extraocular Movements: Extraocular movements intact.     Conjunctiva/sclera: Conjunctivae normal.     Pupils: Pupils are equal, round, and reactive to light.  Cardiovascular:     Rate and Rhythm: Normal rate and regular rhythm.     Pulses: Normal pulses.     Heart sounds: Normal heart sounds. No murmur heard.    No friction rub. No gallop.  Pulmonary:     Effort: Pulmonary effort is normal.     Breath sounds: Normal breath sounds. No wheezing, rhonchi or rales.  Abdominal:     General: Bowel sounds are normal. There is no distension.     Palpations: Abdomen is  soft. There is mass (Palpable, firm mass in right lower abdominal quadrant, mildly tender).     Tenderness: There is no abdominal tenderness.  Musculoskeletal:        General: No swelling. Normal range of motion.     Cervical back: Normal range of motion.     Right lower leg: No edema.     Left lower leg: No edema.  Lymphadenopathy:     Cervical: No cervical adenopathy.  Skin:    General: Skin is warm and dry.     Capillary Refill: Capillary refill takes less than 2 seconds.     Coloration: Skin is not jaundiced.  Neurological:     General: No focal deficit present.     Mental Status: She is alert and oriented to person, place, and time.  Psychiatric:        Mood and Affect: Mood normal.         Behavior: Behavior normal.       Assessment & Plan:   Problem List Items Addressed This Visit       RLQ abdominal mass   Presenting today for an acute visit in the setting of right lower quadrant abdominal pain with a palpable mass.  As described above, symptom onset began 12/15 after eating chili and experiencing violent episodes of emesis.  Pain has somewhat improved but she presents today due to its persistence.  On exam there is a palpable, mildly tender mass in the right lower abdominal quadrant.  She has also lost 15 pounds unintentionally since her last appointment in September 2024. -Labs completed earlier today are pending.  Further management pending results. -Given reported symptoms and exam findings today, CT abdomen pelvis ordered -Will arrange close follow-up for 4 weeks      Return in about 4 weeks (around 11/28/2023).  Kayla Lade, MD

## 2023-11-01 ENCOUNTER — Other Ambulatory Visit: Payer: Self-pay | Admitting: Internal Medicine

## 2023-11-01 ENCOUNTER — Telehealth: Payer: Self-pay

## 2023-11-01 DIAGNOSIS — D649 Anemia, unspecified: Secondary | ICD-10-CM

## 2023-11-01 LAB — CMP14+EGFR
ALT: 10 [IU]/L (ref 0–32)
AST: 17 [IU]/L (ref 0–40)
Albumin: 3.7 g/dL (ref 3.7–4.7)
Alkaline Phosphatase: 100 [IU]/L (ref 44–121)
BUN/Creatinine Ratio: 21 (ref 12–28)
BUN: 18 mg/dL (ref 8–27)
Bilirubin Total: <0.2 mg/dL (ref 0.0–1.2)
CO2: 23 mmol/L (ref 20–29)
Calcium: 8.9 mg/dL (ref 8.7–10.3)
Chloride: 100 mmol/L (ref 96–106)
Creatinine, Ser: 0.84 mg/dL (ref 0.57–1.00)
Globulin, Total: 2.3 g/dL (ref 1.5–4.5)
Glucose: 110 mg/dL — ABNORMAL HIGH (ref 70–99)
Potassium: 5.1 mmol/L (ref 3.5–5.2)
Sodium: 140 mmol/L (ref 134–144)
Total Protein: 6 g/dL (ref 6.0–8.5)
eGFR: 67 mL/min/{1.73_m2} (ref >59)

## 2023-11-01 LAB — VITAMIN D 25 HYDROXY (VIT D DEFICIENCY, FRACTURES): Vit D, 25-Hydroxy: 51.8 ng/mL (ref 30.0–100.0)

## 2023-11-01 LAB — CBC WITH DIFFERENTIAL/PLATELET
Basophils Absolute: 0.1 10*3/uL (ref 0.0–0.2)
Basos: 1 %
EOS (ABSOLUTE): 0.5 10*3/uL — ABNORMAL HIGH (ref 0.0–0.4)
Eos: 4 %
Hematocrit: 28.5 % — ABNORMAL LOW (ref 34.0–46.6)
Hemoglobin: 8.9 g/dL — ABNORMAL LOW (ref 11.1–15.9)
Immature Grans (Abs): 0 10*3/uL (ref 0.0–0.1)
Immature Granulocytes: 0 %
Lymphocytes Absolute: 1.9 10*3/uL (ref 0.7–3.1)
Lymphs: 16 %
MCH: 25.6 pg — ABNORMAL LOW (ref 26.6–33.0)
MCHC: 31.2 g/dL — ABNORMAL LOW (ref 31.5–35.7)
MCV: 82 fL (ref 79–97)
Monocytes Absolute: 0.8 10*3/uL (ref 0.1–0.9)
Monocytes: 7 %
Neutrophils Absolute: 8.2 10*3/uL — ABNORMAL HIGH (ref 1.4–7.0)
Neutrophils: 72 %
Platelets: 422 10*3/uL (ref 150–450)
RBC: 3.48 x10E6/uL — ABNORMAL LOW (ref 3.77–5.28)
RDW: 12.6 % (ref 11.7–15.4)
WBC: 11.5 10*3/uL — ABNORMAL HIGH (ref 3.4–10.8)

## 2023-11-01 LAB — LIPID PANEL
Chol/HDL Ratio: 4.5 {ratio} — ABNORMAL HIGH (ref 0.0–4.4)
Cholesterol, Total: 202 mg/dL — ABNORMAL HIGH (ref 100–199)
HDL: 45 mg/dL (ref >39)
LDL Chol Calc (NIH): 136 mg/dL — ABNORMAL HIGH (ref 0–99)
Triglycerides: 114 mg/dL (ref 0–149)
VLDL Cholesterol Cal: 21 mg/dL (ref 5–40)

## 2023-11-01 LAB — B12 AND FOLATE PANEL
Folate: >20 ng/mL (ref >3.0)
Vitamin B-12: 707 pg/mL (ref 232–1245)

## 2023-11-01 LAB — TSH+FREE T4
Free T4: 1.08 ng/dL (ref 0.82–1.77)
TSH: 2.2 u[IU]/mL (ref 0.450–4.500)

## 2023-11-01 NOTE — Telephone Encounter (Signed)
Copied from CRM [id number]. Topic: Appointments - Scheduling Inquiry for Clinic >> Nov 01, 2023 12:18 PM Victorino Dike T wrote: Reason for CRM: patient wants the 2/11 appointment changed to in person, if any issues call [phone]

## 2023-11-02 NOTE — Telephone Encounter (Signed)
 Called patient will be in office

## 2023-11-03 DIAGNOSIS — Z1231 Encounter for screening mammogram for malignant neoplasm of breast: Secondary | ICD-10-CM | POA: Diagnosis not present

## 2023-11-03 LAB — HM MAMMOGRAPHY

## 2023-11-08 ENCOUNTER — Ambulatory Visit (INDEPENDENT_AMBULATORY_CARE_PROVIDER_SITE_OTHER): Payer: Medicare Other

## 2023-11-08 VITALS — BP 116/62

## 2023-11-08 DIAGNOSIS — D649 Anemia, unspecified: Secondary | ICD-10-CM | POA: Diagnosis not present

## 2023-11-08 DIAGNOSIS — Z0000 Encounter for general adult medical examination without abnormal findings: Secondary | ICD-10-CM | POA: Diagnosis not present

## 2023-11-08 NOTE — Progress Notes (Signed)
Subjective:   Kayla Kayla [name] is a 89 y.o. female who presents for Medicare Annual (Subsequent) preventive examination.  Visit Complete: In person  Patient Medicare AWV questionnaire was completed by the patient on 11/08/2023; I have confirmed that all information answered by patient is correct and no changes since this date.  Cardiac Risk Factors include: advanced age (>43men, >66 women);hypertension;dyslipidemia     Objective:    Today's Vitals   11/08/23 0901 11/08/23 0919  BP: 116/62   PainSc:  0-No pain   There is no height or weight on Kayla [name] to calculate BMI.     11/08/2023    9:08 AM 11/01/2022    9:33 AM 08/02/2019    1:51 AM  Advanced Directives  Does Patient Have a Medical Advance Directive? Yes Yes No  Type of Special educational needs teacher of Kayla Kayla [name];Living will   Does patient want to make changes to medical advance directive? No - Patient declined No - Patient declined   Copy of Healthcare Power of Attorney in Chart?  No - copy requested   Would patient like information on creating a medical advance directive?   No - Patient declined    Current Medications (verified) Outpatient Encounter Medications as of 11/08/2023  Medication Sig   aspirin EC 81 MG tablet Take 1 tablet by mouth daily.   famotidine (PEPCID) 20 MG tablet TAKE 1 TABLET BY MOUTH TWICE  DAILY   Fish Oil-Cholecalciferol (FISH OIL + D3) 1000-1000 MG-UNIT CAPS Take 2 capsules by mouth daily.   Multiple Vitamin (MULTIVITAMIN) tablet Take 1 tablet by mouth daily.   valsartan (DIOVAN) 160 MG tablet Take 1 tablet (160 mg total) by mouth daily.   No facility-administered encounter medications on Kayla [name] as of 11/08/2023.    Allergies (verified) Crestor [rosuvastatin]   History: Past Medical History:  Diagnosis Date   GERD (gastroesophageal reflux disease)    Heart murmur    Hypertension    Past Surgical History:  Procedure Laterality Date   EYE SURGERY     No family history on Kayla [name]. Social  History   Socioeconomic History   Marital status: Widowed    Spouse name: Not on Kayla [name]   Number of children: Not on Kayla [name]   Years of education: Not on Kayla [name]   Highest education level: 12th grade  Occupational History   Not on Kayla [name]  Tobacco Use   Smoking status: Never   Smokeless tobacco: Never  Vaping Use   Vaping status: Never Used  Substance and Sexual Activity   Alcohol use: Never   Drug use: Never   Sexual activity: Not Currently    Birth control/protection: None  Other Topics Concern   Not on Kayla [name]  Social History Narrative   Not on Kayla [name]   Social Drivers of Health   Financial Resource Strain: Low Risk  (11/08/2023)   Overall Financial Resource Strain (CARDIA)    Difficulty of Paying Living Expenses: Not hard at all  Food Insecurity: Unknown (10/27/2023)   Hunger Vital Sign    Worried About Running Out of Food in the Last Year: Not on Kayla [name]    Ran Out of Food in the Last Year: Never true  Transportation Needs: No Transportation Needs (10/27/2023)   PRAPARE - Administrator, Civil Service (Medical): No    Lack of Transportation (Non-Medical): No  Physical Activity: Insufficiently Active (10/27/2023)   Exercise Vital Sign    Days of Exercise per Week: 3 days    Minutes of  Exercise per Session: 30 min  Stress: No Stress Concern Present (10/27/2023)   [employer] of Occupational Health - Occupational Stress Questionnaire    Feeling of Stress : Not at all  Social Connections: Unknown (10/27/2023)   Social Connection and Isolation Panel [NHANES]    Frequency of Communication with Friends and Family: Not on Kayla [name]    Frequency of Social Gatherings with Friends and Family: More than three times a week    Attends Religious Services: More than 4 times per year    Active Member of [employer] or Organizations: Not on Kayla [name]    Attends Banker Meetings: Not on Kayla [name]    Marital Status: Widowed  Recent Concern: Social Connections - Moderately Isolated (10/27/2023)    Social Connection and Isolation Panel [NHANES]    Frequency of Communication with Friends and Family: More than three times a week    Frequency of Social Gatherings with Friends and Family: More than three times a week    Attends Religious Services: More than 4 times per year    Active Member of [employer] or Organizations: No    Attends Banker Meetings: Never    Marital Status: Widowed    Tobacco Counseling Counseling given: Not Answered   Clinical Intake:  Pre-visit preparation completed: Yes  Pain : No/denies pain Pain Score: 0-No pain     Nutritional Status: BMI 25 -29 Overweight Diabetes: No  How often do you need to have someone help you when you read instructions, pamphlets, or other written materials from your doctor or pharmacy?: 1 - Never  Interpreter Needed?: No      Activities of Daily Living    11/08/2023    8:17 AM 11/02/2023    1:05 PM  In your present state of health, do you have any difficulty performing the following activities:  Hearing? 0 0  Vision? 0 0  Difficulty concentrating or making decisions? 0 0  Walking or climbing stairs? 0 0  Dressing or bathing? 0 0  Doing errands, shopping? 0 0  Preparing Food and eating ? N N  Using the Toilet? N N  In the past six months, have you accidently leaked urine? N N  Do you have problems with loss of bowel control? N N  Managing your Medications? N N  Managing your Finances? N N  Housekeeping or managing your Housekeeping? N N    Patient Care Team: Billie Lade, MD as PCP - General (Internal Medicine)  Indicate any recent Medical Services you may have received from other than [facility] providers in the past year (date may be approximate).     Assessment:   This is a routine wellness examination for Kayla [name].  Hearing/Vision screen No results found.   Goals Addressed             This Visit's Progress    Patient Stated   On track    To learn more on computer. Got a new laptop and  has been navigating it well      Prevent falls         Depression Screen    10/31/2023    8:50 AM 05/19/2023   10:03 AM 11/11/2022    8:16 AM 11/01/2022    9:34 AM 11/01/2022    9:32 AM 07/29/2022   10:24 AM  PHQ 2/9 Scores  PHQ - 2 Score 0 0 0 0 0 0  PHQ- 9 Score 0 0 0       Fall  Risk    11/08/2023    8:17 AM 11/02/2023    1:05 PM 10/31/2023    8:50 AM 06/08/2023   11:22 AM 05/19/2023   10:03 AM  Fall Risk   Falls in the past year? 0 0 0 0 0  Number falls in past yr: 0  0 0 0  Injury with Fall? 0  0 0 0  Risk for fall due to :   No Fall Risks  No Fall Risks  Follow up Falls evaluation completed  Falls evaluation completed  Falls evaluation completed    MEDICARE RISK AT HOME: Medicare Risk at Home Any stairs in or around the home?: Yes If so, are there any without handrails?: No Home free of loose throw rugs in walkways, pet beds, electrical cords, etc?: Yes Adequate lighting in your home to reduce risk of falls?: Yes Life alert?: No Use of a cane, walker or w/c?: No Grab bars in the bathroom?: Yes Shower chair or bench in shower?: Yes Elevated toilet seat or a handicapped toilet?: Yes  TIMED UP AND GO:  Was the test performed?  Yes  Length of time to ambulate 10 feet: 6 sec Gait steady and fast without use of assistive device    Cognitive Function:    11/01/2022    9:35 AM  MMSE - Mini Mental State Exam  Not completed: Unable to complete        11/08/2023    9:05 AM 11/01/2022    9:35 AM  6CIT Screen  What Year? 0 points 0 points  What month? 0 points 0 points  What time? 0 points 0 points  Count back from 20 0 points 0 points  Months in reverse 0 points 0 points  Repeat phrase 0 points 0 points  Total Score 0 points 0 points    Immunizations Immunization History  Administered Date(s) Administered   Fluad Quad(high Dose 65+) 07/26/2022   Fluad Trivalent(High Dose 65+) 06/08/2023   Influenza-Unspecified 06/18/2021   Moderna Sars-Covid-2 Vaccination  11/14/2019, 12/12/2019, 08/16/2020, 07/03/2021   PNEUMOCOCCAL CONJUGATE-20 10/23/2021   Pneumococcal Conjugate-13 05/23/2017   Tdap 11/18/2022   Zoster Recombinant(Shingrix) 11/26/2021, 01/25/2022    TDAP status: Up to date  Flu Vaccine status: Up to date  Pneumococcal vaccine status: Up to date  Covid-19 vaccine status: Information provided on how to obtain vaccines.   Qualifies for Shingles Vaccine? Yes   Zostavax completed Yes   Shingrix Completed?: Yes  Screening Tests Health Maintenance  Topic Date Due   Medicare Annual Wellness (AWV)  11/07/2024   DTaP/Tdap/Td (2 - Td or Tdap) 11/18/2032   Pneumonia Vaccine 48+ Years old  Completed   INFLUENZA VACCINE  Completed   DEXA SCAN  Completed   Zoster Vaccines- Shingrix  Completed   HPV VACCINES  Aged Out   COVID-19 Vaccine  Discontinued    Health Maintenance  There are no preventive care reminders to display for this patient.   Colorectal cancer screening: No longer required.   Mammogram status: Completed feb 2025. Repeat every year  Bone Density status: Completed 2023. Results reflect: Bone density results: OSTEOPENIA. Repeat every 2 years.  Lung Cancer Screening: (Low Dose CT Chest recommended if Age 13-80 years, 20 pack-year currently smoking OR have quit w/in 15years.) does not qualify.   Lung Cancer Screening Referral: na  Additional Screening:  Hepatitis C Screening: does not qualify; Completed   Vision Screening: Recommended annual ophthalmology exams for early detection of glaucoma and other disorders of the  eye. Is the patient up to date with their annual eye exam?  Yes  Who is the provider or what is the name of the office in which the patient attends annual eye exams? [facility] If pt is not established with a provider, would they like to be referred to a provider to establish care?  na .   Dental Screening: Recommended annual dental exams for proper oral hygiene    Community Resource Referral /  Chronic Care Management: CRR required this visit?  No   CCM required this visit?  No     Plan:     I have personally reviewed and noted the following in the patient's chart:   Medical and social history Use of alcohol, tobacco or illicit drugs  Current medications and supplements including opioid prescriptions. Patient is not currently taking opioid prescriptions. Functional ability and status Nutritional status Physical activity Advanced directives List of other physicians Hospitalizations, surgeries, and ER visits in previous 12 months Vitals Screenings to include cognitive, depression, and falls Referrals and appointments  In addition, I have reviewed and discussed with patient certain preventive protocols, quality metrics, and best practice recommendations. A written personalized care plan for preventive services as well as general preventive health recommendations were provided to patient.     Abner Greenspan, LPN   [id number]   After Visit Summary: (In Person-Printed) AVS printed and given to the patient  Nurse Notes:

## 2023-11-08 NOTE — Patient Instructions (Signed)
  Kayla [name] , Thank you for taking time to come for your Medicare Wellness Visit. I appreciate your ongoing commitment to your health goals. Please review the following plan we discussed and let me know if I can assist you in the future.   These are the goals we discussed:  Goals      Patient Stated     To learn more on computer. Got a new laptop and has been navigating it well      Prevent falls        This is a list of the screening recommended for you and due dates:  Health Maintenance  Topic Date Due   Medicare Annual Wellness Visit  11/07/2024   DTaP/Tdap/Td vaccine (2 - Td or Tdap) 11/18/2032   Pneumonia Vaccine  Completed   Flu Shot  Completed   DEXA scan (bone density measurement)  Completed   Zoster (Shingles) Vaccine  Completed   HPV Vaccine  Aged Out   COVID-19 Vaccine  Discontinued

## 2023-11-09 ENCOUNTER — Encounter (INDEPENDENT_AMBULATORY_CARE_PROVIDER_SITE_OTHER): Payer: Self-pay | Admitting: *Deleted

## 2023-11-09 ENCOUNTER — Other Ambulatory Visit: Payer: Self-pay | Admitting: Internal Medicine

## 2023-11-09 ENCOUNTER — Encounter: Payer: Self-pay | Admitting: Internal Medicine

## 2023-11-09 DIAGNOSIS — R1903 Right lower quadrant abdominal swelling, mass and lump: Secondary | ICD-10-CM | POA: Insufficient documentation

## 2023-11-09 DIAGNOSIS — D509 Iron deficiency anemia, unspecified: Secondary | ICD-10-CM

## 2023-11-09 LAB — CBC WITH DIFFERENTIAL/PLATELET
Basophils Absolute: 0.1 10*3/uL (ref 0.0–0.2)
Basos: 1 %
EOS (ABSOLUTE): 0.5 10*3/uL — ABNORMAL HIGH (ref 0.0–0.4)
Eos: 5 %
Hematocrit: 26.2 % — ABNORMAL LOW (ref 34.0–46.6)
Hemoglobin: 8.1 g/dL — ABNORMAL LOW (ref 11.1–15.9)
Immature Grans (Abs): 0 10*3/uL (ref 0.0–0.1)
Immature Granulocytes: 0 %
Lymphocytes Absolute: 1.6 10*3/uL (ref 0.7–3.1)
Lymphs: 16 %
MCH: 24.8 pg — ABNORMAL LOW (ref 26.6–33.0)
MCHC: 30.9 g/dL — ABNORMAL LOW (ref 31.5–35.7)
MCV: 80 fL (ref 79–97)
Monocytes Absolute: 0.8 10*3/uL (ref 0.1–0.9)
Monocytes: 8 %
Neutrophils Absolute: 6.7 10*3/uL (ref 1.4–7.0)
Neutrophils: 70 %
Platelets: 409 10*3/uL (ref 150–450)
RBC: 3.27 x10E6/uL — ABNORMAL LOW (ref 3.77–5.28)
RDW: 12.3 % (ref 11.7–15.4)
WBC: 9.7 10*3/uL (ref 3.4–10.8)

## 2023-11-09 LAB — IRON,TIBC AND FERRITIN PANEL
Ferritin: 25 ng/mL (ref 15–150)
Iron Saturation: 7 % — CL (ref 15–55)
Iron: 21 ug/dL — ABNORMAL LOW (ref 27–139)
Total Iron Binding Capacity: 320 ug/dL (ref 250–450)
UIBC: 299 ug/dL (ref 118–369)

## 2023-11-09 MED ORDER — IRON (FERROUS SULFATE) 325 (65 FE) MG PO TABS: 325.0000 mg | ORAL_TABLET | Freq: Every day | ORAL | 3 refills | Status: DC

## 2023-11-09 NOTE — Assessment & Plan Note (Signed)
Presenting today for an acute visit in the setting of right lower quadrant abdominal pain with a palpable mass.  As described above, symptom onset began 12/15 after eating chili and experiencing violent episodes of emesis.  Pain has somewhat improved but she presents today due to its persistence.  On exam there is a palpable, mildly tender mass in the right lower abdominal quadrant.  She has also lost 15 pounds unintentionally since her last appointment in September 2024. -Labs completed earlier today are pending.  Further management pending results. -Given reported symptoms and exam findings today, CT abdomen pelvis ordered -Will arrange close follow-up for 4 weeks

## 2023-11-11 ENCOUNTER — Ambulatory Visit (HOSPITAL_COMMUNITY)
Admission: RE | Admit: 2023-11-11 | Discharge: 2023-11-11 | Disposition: A | Discharge status: Home / Self Care | Payer: Medicare Other | Source: Ambulatory Visit | Attending: Internal Medicine | Admitting: Internal Medicine

## 2023-11-11 ENCOUNTER — Other Ambulatory Visit: Payer: Self-pay

## 2023-11-11 DIAGNOSIS — R1031 Right lower quadrant pain: Secondary | ICD-10-CM | POA: Insufficient documentation

## 2023-11-11 DIAGNOSIS — N3289 Other specified disorders of bladder: Secondary | ICD-10-CM | POA: Diagnosis not present

## 2023-11-11 DIAGNOSIS — D509 Iron deficiency anemia, unspecified: Secondary | ICD-10-CM

## 2023-11-11 DIAGNOSIS — K5730 Diverticulosis of large intestine without perforation or abscess without bleeding: Secondary | ICD-10-CM | POA: Diagnosis not present

## 2023-11-11 MED ORDER — IRON (FERROUS SULFATE) 325 (65 FE) MG PO TABS: 325.0000 mg | ORAL_TABLET | Freq: Every day | ORAL | 3 refills | Status: AC

## 2023-11-14 ENCOUNTER — Ambulatory Visit (INDEPENDENT_AMBULATORY_CARE_PROVIDER_SITE_OTHER): Payer: Medicare Other | Admitting: Gastroenterology

## 2023-11-14 ENCOUNTER — Other Ambulatory Visit (HOSPITAL_COMMUNITY): Payer: Medicare Other

## 2023-11-14 ENCOUNTER — Encounter: Payer: Self-pay | Admitting: Gastroenterology

## 2023-11-14 VITALS — BP 126/57 | HR 103 | Temp 97.8°F | Ht 69.0 in | Wt 157.6 lb

## 2023-11-14 DIAGNOSIS — R634 Abnormal weight loss: Secondary | ICD-10-CM

## 2023-11-14 DIAGNOSIS — K219 Gastro-esophageal reflux disease without esophagitis: Secondary | ICD-10-CM | POA: Diagnosis not present

## 2023-11-14 DIAGNOSIS — R630 Anorexia: Secondary | ICD-10-CM | POA: Diagnosis not present

## 2023-11-14 DIAGNOSIS — D509 Iron deficiency anemia, unspecified: Secondary | ICD-10-CM | POA: Diagnosis not present

## 2023-11-14 NOTE — Progress Notes (Signed)
GI Office Note    Referring Provider: Billie Lade, MD Primary Care Physician:  Billie Lade, MD  Primary Gastroenterologist: Hennie Duos. Marletta Lor, DO  Chief Complaint   Chief Complaint  Patient presents with   IDA    Patient here today for a follow up on IDA. She also had a Ct abd done on 11/11/2023, which does not look like this is resulted. Patient denies dizziness, fatigue, has had some issues with shortness of breath with exertion, no chest pain. No dark or bloody stools seen. She is taking Ferrous sulfate once per day.   History of Present Illness   Kayla [name] is a 89 y.o. female presenting today at the request of Billie Lade, MD for iron deficiency anemia.   [facility] last in October 2021.  Followed there for asymptomatic cholelithiasis and GERD.  Biliary colic was occurring about once per month.  GERD was well maintained on pantoprazole 40 mg once daily.  She was advised that she needed regular DEXA scans and monitoring of B12, iron, calcium, magnesium, and vitamin D.  She was in agreement with this.  Colonoscopy 2009 (age [age]): -Cecum reached -Colon normal. -Diverticulosis noted. -Colonoscopy every 10 years  Office visit with PCP 10/31/2023.  She reported multiple episodes of nausea vomiting after eating chili.  Also had some right lower quadrant abdominal pain at the time.  Has lost weight, but this has been intentional.  Had lost about 15 pounds since her prior office visit.  Given her abdominal pain, CT abdomen pelvis ordered.  Per review of chart, patient message on 11/09/2023, patient was advised to see GI given some of her lab results.  She was advised to start oral iron supplementation for iron deficiency.  CT scan is ordered.  She reported her GERD symptoms were well-controlled with famotidine once daily.     Latest Ref Rng & Units 11/08/2023    8:41 AM 10/31/2023    9:42 AM 10/18/2022    9:13 AM   CBC  WBC 3.4 - 10.8 x10E3/uL 9.7  11.5  6.8   Hemoglobin 11.1 - 15.9 g/dL 8.1  8.9  16.1   Hematocrit 34.0 - 46.6 % 26.2  28.5  35.8   Platelets 150 - 450 x10E3/uL 409  422  262    Iron/TIBC/Ferritin/ %Sat    Component Value Date/Time   IRON 21 (L) 11/08/2023 0841   TIBC 320 11/08/2023 0841   FERRITIN 25 11/08/2023 0841   IRONPCTSAT 7 (LL) 11/08/2023 0841    CT A/P without contrast 11/11/2023: Exam completed but read is pending.  Today:  Has had some dyspnea on exertion.  Denies any dizziness, fatigue, chest pain, melena, or BRBPR.  Currently taking ferrous sulfate once per day.  Has not felt sick but since December after eating chili from fast food she did not wan food but did not feel terrible initially. The next day though however she threw up very violently and happened all night long and then she had some mild diarrhea. For several days she did not eat much at all. Abdomen got sore initially after that and she thought it would gt better and never hurt continuously but would hurt when she walked and turned in bed. Right now she can have some issues with intermittent pain. She states she can fel a hard knot in the RLQ and has not gone away. Able to feel it and not sharper. Pain  is less severe, more achy in nature. Has needed tylenol for it before. At times she may have some twinges of pain that go to her back.  Used to have a pain every time she used to walk - improved but not gone.  Not having constipation or diarrhea. On average has a BM every other day, sometimes harder than others but overall goes well. May have some incomplete emptying at times. Usually urinates well, drinks lots of water.   States she was told before that it could be her gallbladder.   She states that she had some lab work that she had some decrease in kidney function initially.   Has had a cyst on her ovaries in the past and had it removed (found incidentally).   Takes famotidine one per day and if she does  she has no reflux. Symptoms worsen when she goes out to eat. Does not have regular reflux.   NSAIDs: none.   Weight loss had been intentional. Last year this time weighed 180lbs, with water aerobics she got down to 170 and then her weight had dropped off and did not notice at home that she was dropping weight.   Has been trying to eat more. 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 Loves breakfast.  Wt Readings from Last 3 Encounters:  11/14/23 157 lb 9.6 oz (71.5 kg)  10/31/23 155 lb 9.6 oz (70.6 kg)  06/08/23 170 lb (77.1 kg)    Current Outpatient Medications  Medication Sig Dispense Refill   aspirin EC 81 MG tablet Take 1 tablet by mouth daily.     famotidine (PEPCID) 20 MG tablet TAKE 1 TABLET BY MOUTH TWICE  DAILY 180 tablet 3   Fish Oil-Cholecalciferol (FISH OIL + D3) 1000-1000 MG-UNIT CAPS Take 2 capsules by mouth daily.     Iron, Ferrous Sulfate, 325 (65 Fe) MG TABS Take 325 mg by mouth daily. 30 tablet 3   Multiple Vitamin (MULTIVITAMIN) tablet Take 1 tablet by mouth daily.     valsartan (DIOVAN) 160 MG tablet Take 1 tablet (160 mg total) by mouth daily. 90 tablet 3   No current facility-administered medications for this visit.    Past Medical History:  Diagnosis Date   GERD (gastroesophageal reflux disease)    Heart murmur    Hypertension     Past Surgical History:  Procedure Laterality Date   EYE SURGERY      History reviewed. No pertinent family history.  Allergies as of 11/14/2023 - Review Complete 11/14/2023  Allergen Reaction Noted   Crestor [rosuvastatin]  07/29/2022    Social History   Socioeconomic History   Marital status: Widowed    Spouse name: Not on file   Number of children: Not on file   Years of education: Not on file   Highest education level: 12th grade  Occupational History   Not on file  Tobacco Use   Smoking status: Never   Smokeless tobacco: Never  Vaping Use   Vaping status: Never Used  Substance and Sexual Activity   Alcohol use:  Never   Drug use: Never   Sexual activity: Not Currently    Birth control/protection: None  Other Topics Concern   Not on file  Social History Narrative   Not on file   Social Drivers of Health   Financial Resource Strain: Low Risk  (11/08/2023)   Overall Financial Resource Strain (CARDIA)    Difficulty of Paying Living Expenses: Not hard at all  Food Insecurity: Unknown (  10/27/2023)   Hunger Vital Sign    Worried About Running Out of Food in the Last Year: Not on file    Ran Out of Food in the Last Year: Never true  Transportation Needs: No Transportation Needs (10/27/2023)   PRAPARE - Administrator, Civil Service (Medical): No    Lack of Transportation (Non-Medical): No  Physical Activity: Insufficiently Active (10/27/2023)   Exercise Vital Sign    Days of Exercise per Week: 3 days    Minutes of Exercise per Session: 30 min  Stress: No Stress Concern Present (10/27/2023)   [employer] of Occupational Health - Occupational Stress Questionnaire    Feeling of Stress : Not at all  Social Connections: Unknown (10/27/2023)   Social Connection and Isolation Panel [NHANES]    Frequency of Communication with Friends and Family: Not on file    Frequency of Social Gatherings with Friends and Family: More than three times a week    Attends Religious Services: More than 4 times per year    Active Member of [employer] or Organizations: Not on file    Attends Banker Meetings: Not on file    Marital Status: Widowed  Recent Concern: Social Connections - Moderately Isolated (10/27/2023)   Social Connection and Isolation Panel [NHANES]    Frequency of Communication with Friends and Family: More than three times a week    Frequency of Social Gatherings with Friends and Family: More than three times a week    Attends Religious Services: More than 4 times per year    Active Member of [employer] or Organizations: No    Attends Banker Meetings: Never    Marital  Status: Widowed  Intimate Partner Violence: Not At Risk (11/08/2023)   Humiliation, Afraid, Rape, and Kick questionnaire    Fear of Current or Ex-Partner: No    Emotionally Abused: No    Physically Abused: No    Sexually Abused: No     Review of Systems   Gen: + decreased appetite, weight loss. Denies any fever, chills, fatigue CV: Denies chest pain, heart palpitations, peripheral edema, syncope.  Resp: + dyspnea on exertion. Denies shortness of breath at rest. Denies wheezing or cough.  GI: see HPI GU : Denies urinary burning, urinary frequency, urinary hesitancy MS: Denies joint pain, muscle weakness, cramps, or limitation of movement.  Derm: Denies rash, itching, dry skin Psych: Denies depression, anxiety, memory loss, and confusion Heme: Denies bruising, bleeding, and enlarged lymph nodes.  Physical Exam   BP (!) 126/57 (BP Location: Right Arm, Patient Position: Sitting, Cuff Size: Normal)   Pulse (!) 103   Temp 97.8 F (36.6 C) (Temporal)   Ht 5\' 9"  (1.753 m)   Wt 157 lb 9.6 oz (71.5 kg)   BMI 23.27 kg/m   General:   Alert and oriented. Pleasant and cooperative. Well-nourished and well-developed.  Head:  Normocephalic and atraumatic. Eyes:  Without icterus, sclera clear and conjunctiva pink.  Ears:  Normal auditory acuity. Mouth:  No deformity or lesions, oral mucosa pink.   Abdomen:  +BS, soft, non-distended.  Mild TTP to RLQ at site of firmness/nodularity (difficult to determine mass versus increased tightness of abdominal wall and size of area). No HSM noted.  Rectal:  Deferred  Neurologic:  Alert and  oriented x4;  grossly normal neurologically. Skin:  Intact without significant lesions or rashes. Psych:  Alert and cooperative. Normal mood and affect.  Assessment   TREAZURE NERY is a  89 y.o. female with a history of GERD, heart murmur, HTN presenting today for evaluation of anemia.   GERD: Well-controlled with famotidine 20 mg once daily.  Denies any nausea,  vomiting, dysphagia, or odynophagia.  Has had a decreased appetite.  IDA: Significant drop in hemoglobin within the last year.  Hemoglobin 11.6 in January 2024.  Hemoglobin 8.9 on 2/3 and now down to 8.1 on 2/11.  Denies any melena or BRBPR.  Denies any tobacco use or chronic NSAID use.  No alcohol use.  She denies any chest pain.  She does have some mild dyspnea on exertion that is relieved quickly with rest.  Recently started iron supplementation as recommended by Dr. Durwin Nora.  Last colonoscopy in 2009 which was normal other than some mild diverticulosis.  Given significant change in some mild symptoms including unintentional weight loss as noted below, I have advised EGD and colonoscopy, she would like to see what CT says in pending findings and repeat blood work, would be willing to undergo EGD and colonoscopy.  Firmness noted to the right lower quadrant.  Advised continue oral iron for now, plan to repeat CBC the end of this week to assess for further decline.  Unintentional weight loss, nodule to the right lower quadrant: History of weight loss which was somewhat purposeful from 180-170 pounds, however she reports that since September when she was 170, she has lost about 15 pounds and at this point was not trying as she was not superactive.  She does have a decreased appetite however this has been going on for a little while as she has gotten older.  Most recent labs with normal B12, folate, and vitamin D as well as normal TSH.  Normal kidney and liver function.  Has area of firmness that is difficult to decipher size on examination to the RLQ that is mildly tender on exam.  Sometimes heating pad helps improve her discomfort.  PLAN   EGD and Colonoscopy pending CT findings and follow up labwork, strongly encouraged if further drop in hemoglobin CBC Thursday or Friday this week Continue famotidine 20 mg once daily. Continue oral iron daily.  Monitor for rectal bleeding/melena High protein  diet Heating pad if needed to lower abdomen.  Follow up CT scan, asked for expedited read.    Brooke Bonito, MSN, FNP-BC, AGACNP-BC [facility]

## 2023-11-14 NOTE — Patient Instructions (Addendum)
Please go to [facility] later this week or early next week for repeat blood work. This is on the second floor of [address] next to the GI office toward the stairwell.   Regardless of CT findings, if your blood counts drop further I would recommend [place] doing a repeat colonoscopy and upper endoscopy to look for a source of your anemia.   I will reach out to Dr. Durwin Nora about getting your CT results and our plan going forward.  If you experience any significant shortness of breath, but does not improve with sitting, black/tarry stools, blood in your stool, or worsening abdominal pain that does not improve please let our office know and go to the ED.  Continue taking your iron supplement once daily as well as your famotidine once daily.  If you begin to have worsening heartburn type symptoms or any trouble swallowing please let me know immediately.  + You may take Tylenol as needed for pain. + Continue using a heating pad to the right lower quadrant if this is improving your symptoms. + If you begin to have any worsening constipation, start MiraLAX.  We will determine follow-up after getting your CT scan results.  It was a pleasure to see you today. I want to create trusting relationships with patients. If you receive a survey regarding your visit,  I greatly appreciate you taking time to fill this out on paper or through your MyChart. I value your feedback.  Brooke Bonito, MSN, FNP-BC, AGACNP-BC [facility]

## 2023-11-14 NOTE — H&P (View-Only) (Signed)
 GI Office Note    Referring Provider: Billie Lade, MD Primary Care Physician:  Billie Lade, MD  Primary Gastroenterologist: Hennie Duos. Marletta Lor, DO  Chief Complaint   Chief Complaint  Patient presents with   IDA    Patient here today for a follow up on IDA. She also had a Ct abd done on 11/11/2023, which does not look like this is resulted. Patient denies dizziness, fatigue, has had some issues with shortness of breath with exertion, no chest pain. No dark or bloody stools seen. She is taking Ferrous sulfate once per day.   History of Present Illness   Kayla [name] is a 89 y.o. female presenting today at the request of Billie Lade, MD for iron deficiency anemia.   [facility] last in October 2021.  Followed there for asymptomatic cholelithiasis and GERD.  Biliary colic was occurring about once per month.  GERD was well maintained on pantoprazole 40 mg once daily.  She was advised that she needed regular DEXA scans and monitoring of B12, iron, calcium, magnesium, and vitamin D.  She was in agreement with this.  Colonoscopy 2009 (age [age]): -Cecum reached -Colon normal. -Diverticulosis noted. -Colonoscopy every 10 years  Office visit with PCP 10/31/2023.  She reported multiple episodes of nausea vomiting after eating chili.  Also had some right lower quadrant abdominal pain at the time.  Has lost weight, but this has been intentional.  Had lost about 15 pounds since her prior office visit.  Given her abdominal pain, CT abdomen pelvis ordered.  Per review of chart, patient message on 11/09/2023, patient was advised to see GI given some of her lab results.  She was advised to start oral iron supplementation for iron deficiency.  CT scan is ordered.  She reported her GERD symptoms were well-controlled with famotidine once daily.     Latest Ref Rng & Units 11/08/2023    8:41 AM 10/31/2023    9:42 AM 10/18/2022    9:13 AM   CBC  WBC 3.4 - 10.8 x10E3/uL 9.7  11.5  6.8   Hemoglobin 11.1 - 15.9 g/dL 8.1  8.9  16.1   Hematocrit 34.0 - 46.6 % 26.2  28.5  35.8   Platelets 150 - 450 x10E3/uL 409  422  262    Iron/TIBC/Ferritin/ %Sat    Component Value Date/Time   IRON 21 (L) 11/08/2023 0841   TIBC 320 11/08/2023 0841   FERRITIN 25 11/08/2023 0841   IRONPCTSAT 7 (LL) 11/08/2023 0841    CT A/P without contrast 11/11/2023: Exam completed but read is pending.  Today:  Has had some dyspnea on exertion.  Denies any dizziness, fatigue, chest pain, melena, or BRBPR.  Currently taking ferrous sulfate once per day.  Has not felt sick but since December after eating chili from fast food she did not wan food but did not feel terrible initially. The next day though however she threw up very violently and happened all night long and then she had some mild diarrhea. For several days she did not eat much at all. Abdomen got sore initially after that and she thought it would gt better and never hurt continuously but would hurt when she walked and turned in bed. Right now she can have some issues with intermittent pain. She states she can fel a hard knot in the RLQ and has not gone away. Able to feel it and not sharper. Pain  is less severe, more achy in nature. Has needed tylenol for it before. At times she may have some twinges of pain that go to her back.  Used to have a pain every time she used to walk - improved but not gone.  Not having constipation or diarrhea. On average has a BM every other day, sometimes harder than others but overall goes well. May have some incomplete emptying at times. Usually urinates well, drinks lots of water.   States she was told before that it could be her gallbladder.   She states that she had some lab work that she had some decrease in kidney function initially.   Has had a cyst on her ovaries in the past and had it removed (found incidentally).   Takes famotidine one per day and if she does  she has no reflux. Symptoms worsen when she goes out to eat. Does not have regular reflux.   NSAIDs: none.   Weight loss had been intentional. Last year this time weighed 180lbs, with water aerobics she got down to 170 and then her weight had dropped off and did not notice at home that she was dropping weight.   Has been trying to eat more. 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 Loves breakfast.  Wt Readings from Last 3 Encounters:  11/14/23 157 lb 9.6 oz (71.5 kg)  10/31/23 155 lb 9.6 oz (70.6 kg)  06/08/23 170 lb (77.1 kg)    Current Outpatient Medications  Medication Sig Dispense Refill   aspirin EC 81 MG tablet Take 1 tablet by mouth daily.     famotidine (PEPCID) 20 MG tablet TAKE 1 TABLET BY MOUTH TWICE  DAILY 180 tablet 3   Fish Oil-Cholecalciferol (FISH OIL + D3) 1000-1000 MG-UNIT CAPS Take 2 capsules by mouth daily.     Iron, Ferrous Sulfate, 325 (65 Fe) MG TABS Take 325 mg by mouth daily. 30 tablet 3   Multiple Vitamin (MULTIVITAMIN) tablet Take 1 tablet by mouth daily.     valsartan (DIOVAN) 160 MG tablet Take 1 tablet (160 mg total) by mouth daily. 90 tablet 3   No current facility-administered medications for this visit.    Past Medical History:  Diagnosis Date   GERD (gastroesophageal reflux disease)    Heart murmur    Hypertension     Past Surgical History:  Procedure Laterality Date   EYE SURGERY      History reviewed. No pertinent family history.  Allergies as of 11/14/2023 - Review Complete 11/14/2023  Allergen Reaction Noted   Crestor [rosuvastatin]  07/29/2022    Social History   Socioeconomic History   Marital status: Widowed    Spouse name: Not on file   Number of children: Not on file   Years of education: Not on file   Highest education level: 12th grade  Occupational History   Not on file  Tobacco Use   Smoking status: Never   Smokeless tobacco: Never  Vaping Use   Vaping status: Never Used  Substance and Sexual Activity   Alcohol use:  Never   Drug use: Never   Sexual activity: Not Currently    Birth control/protection: None  Other Topics Concern   Not on file  Social History Narrative   Not on file   Social Drivers of Health   Financial Resource Strain: Low Risk  (11/08/2023)   Overall Financial Resource Strain (CARDIA)    Difficulty of Paying Living Expenses: Not hard at all  Food Insecurity: Unknown (  10/27/2023)   Hunger Vital Sign    Worried About Running Out of Food in the Last Year: Not on file    Ran Out of Food in the Last Year: Never true  Transportation Needs: No Transportation Needs (10/27/2023)   PRAPARE - Administrator, Civil Service (Medical): No    Lack of Transportation (Non-Medical): No  Physical Activity: Insufficiently Active (10/27/2023)   Exercise Vital Sign    Days of Exercise per Week: 3 days    Minutes of Exercise per Session: 30 min  Stress: No Stress Concern Present (10/27/2023)   [employer] of Occupational Health - Occupational Stress Questionnaire    Feeling of Stress : Not at all  Social Connections: Unknown (10/27/2023)   Social Connection and Isolation Panel [NHANES]    Frequency of Communication with Friends and Family: Not on file    Frequency of Social Gatherings with Friends and Family: More than three times a week    Attends Religious Services: More than 4 times per year    Active Member of [employer] or Organizations: Not on file    Attends Banker Meetings: Not on file    Marital Status: Widowed  Recent Concern: Social Connections - Moderately Isolated (10/27/2023)   Social Connection and Isolation Panel [NHANES]    Frequency of Communication with Friends and Family: More than three times a week    Frequency of Social Gatherings with Friends and Family: More than three times a week    Attends Religious Services: More than 4 times per year    Active Member of [employer] or Organizations: No    Attends Banker Meetings: Never    Marital  Status: Widowed  Intimate Partner Violence: Not At Risk (11/08/2023)   Humiliation, Afraid, Rape, and Kick questionnaire    Fear of Current or Ex-Partner: No    Emotionally Abused: No    Physically Abused: No    Sexually Abused: No     Review of Systems   Gen: + decreased appetite, weight loss. Denies any fever, chills, fatigue CV: Denies chest pain, heart palpitations, peripheral edema, syncope.  Resp: + dyspnea on exertion. Denies shortness of breath at rest. Denies wheezing or cough.  GI: see HPI GU : Denies urinary burning, urinary frequency, urinary hesitancy MS: Denies joint pain, muscle weakness, cramps, or limitation of movement.  Derm: Denies rash, itching, dry skin Psych: Denies depression, anxiety, memory loss, and confusion Heme: Denies bruising, bleeding, and enlarged lymph nodes.  Physical Exam   BP (!) 126/57 (BP Location: Right Arm, Patient Position: Sitting, Cuff Size: Normal)   Pulse (!) 103   Temp 97.8 F (36.6 C) (Temporal)   Ht 5\' 9"  (1.753 m)   Wt 157 lb 9.6 oz (71.5 kg)   BMI 23.27 kg/m   General:   Alert and oriented. Pleasant and cooperative. Well-nourished and well-developed.  Head:  Normocephalic and atraumatic. Eyes:  Without icterus, sclera clear and conjunctiva pink.  Ears:  Normal auditory acuity. Mouth:  No deformity or lesions, oral mucosa pink.   Abdomen:  +BS, soft, non-distended.  Mild TTP to RLQ at site of firmness/nodularity (difficult to determine mass versus increased tightness of abdominal wall and size of area). No HSM noted.  Rectal:  Deferred  Neurologic:  Alert and  oriented x4;  grossly normal neurologically. Skin:  Intact without significant lesions or rashes. Psych:  Alert and cooperative. Normal mood and affect.  Assessment   TREAZURE NERY is a  89 y.o. female with a history of GERD, heart murmur, HTN presenting today for evaluation of anemia.   GERD: Well-controlled with famotidine 20 mg once daily.  Denies any nausea,  vomiting, dysphagia, or odynophagia.  Has had a decreased appetite.  IDA: Significant drop in hemoglobin within the last year.  Hemoglobin 11.6 in January 2024.  Hemoglobin 8.9 on 2/3 and now down to 8.1 on 2/11.  Denies any melena or BRBPR.  Denies any tobacco use or chronic NSAID use.  No alcohol use.  She denies any chest pain.  She does have some mild dyspnea on exertion that is relieved quickly with rest.  Recently started iron supplementation as recommended by Dr. Durwin Nora.  Last colonoscopy in 2009 which was normal other than some mild diverticulosis.  Given significant change in some mild symptoms including unintentional weight loss as noted below, I have advised EGD and colonoscopy, she would like to see what CT says in pending findings and repeat blood work, would be willing to undergo EGD and colonoscopy.  Firmness noted to the right lower quadrant.  Advised continue oral iron for now, plan to repeat CBC the end of this week to assess for further decline.  Unintentional weight loss, nodule to the right lower quadrant: History of weight loss which was somewhat purposeful from 180-170 pounds, however she reports that since September when she was 170, she has lost about 15 pounds and at this point was not trying as she was not superactive.  She does have a decreased appetite however this has been going on for a little while as she has gotten older.  Most recent labs with normal B12, folate, and vitamin D as well as normal TSH.  Normal kidney and liver function.  Has area of firmness that is difficult to decipher size on examination to the RLQ that is mildly tender on exam.  Sometimes heating pad helps improve her discomfort.  PLAN   EGD and Colonoscopy pending CT findings and follow up labwork, strongly encouraged if further drop in hemoglobin CBC Thursday or Friday this week Continue famotidine 20 mg once daily. Continue oral iron daily.  Monitor for rectal bleeding/melena High protein  diet Heating pad if needed to lower abdomen.  Follow up CT scan, asked for expedited read.    Brooke Bonito, MSN, FNP-BC, AGACNP-BC [facility]

## 2023-11-15 ENCOUNTER — Other Ambulatory Visit: Payer: Self-pay | Admitting: *Deleted

## 2023-11-15 ENCOUNTER — Encounter: Payer: Self-pay | Admitting: *Deleted

## 2023-11-15 ENCOUNTER — Telehealth: Payer: Self-pay

## 2023-11-15 ENCOUNTER — Telehealth: Payer: Self-pay | Admitting: Gastroenterology

## 2023-11-15 MED ORDER — PEG 3350-KCL-NA BICARB-NACL 420 G PO SOLR: 4000.0000 mL | Freq: Once | ORAL | 0 refills | Status: AC

## 2023-11-15 NOTE — Telephone Encounter (Signed)
Copied from CRM [id number]. Topic: Clinical - Lab/Test Results >> Nov 15, 2023  9:21 AM Higinio Roger wrote: Reason for CRM: Diane from [facility] wanted to ensure the imaging report: CT ABDOMEN PELVIS WO CONTRAST dated 11/11/23 made it to Dr. Durwin Nora. Contact #: [phone]

## 2023-11-15 NOTE — Telephone Encounter (Signed)
Spoke with patient regarding her recent CT findings.  She is also spoken with Dr. Durwin Nora regarding this.  We need to get her in ASAP for EGD and colonoscopy.  Per her chart I had chosen her provider to be Dr. Marletta Lor however given the urgency of her recent findings, we can schedule her for both procedures as ASA 2 with whichever provider has the soonest availability.  Hold iron for 1 week prior if able.  Brooke Bonito, MSN, APRN, FNP-BC, AGACNP-BC [facility]

## 2023-11-15 NOTE — Telephone Encounter (Signed)
 Thank you :)

## 2023-11-15 NOTE — Telephone Encounter (Signed)
Pt has been scheduled for 11/21/23 with Dr.Ahmed. Instructions sent via mychart and prep sent to the pharmacy.

## 2023-11-16 ENCOUNTER — Encounter: Payer: Self-pay | Admitting: Gastroenterology

## 2023-11-16 ENCOUNTER — Ambulatory Visit: Payer: Medicare Other | Admitting: Gastroenterology

## 2023-11-18 DIAGNOSIS — D509 Iron deficiency anemia, unspecified: Secondary | ICD-10-CM | POA: Diagnosis not present

## 2023-11-18 LAB — HEMOGLOBIN AND HEMATOCRIT, BLOOD
HCT: 28.5 % — ABNORMAL LOW (ref 35.0–45.0)
Hemoglobin: 8.7 g/dL — ABNORMAL LOW (ref 11.7–15.5)

## 2023-11-21 ENCOUNTER — Telehealth (INDEPENDENT_AMBULATORY_CARE_PROVIDER_SITE_OTHER): Payer: Self-pay | Admitting: *Deleted

## 2023-11-21 ENCOUNTER — Encounter (HOSPITAL_COMMUNITY)
Admission: RE | Disposition: A | Discharge status: Home / Self Care | Payer: Self-pay | Source: Home / Self Care | Attending: Gastroenterology

## 2023-11-21 ENCOUNTER — Telehealth (INDEPENDENT_AMBULATORY_CARE_PROVIDER_SITE_OTHER): Payer: Self-pay | Admitting: Gastroenterology

## 2023-11-21 ENCOUNTER — Encounter (HOSPITAL_COMMUNITY): Payer: Self-pay | Admitting: Gastroenterology

## 2023-11-21 ENCOUNTER — Other Ambulatory Visit: Payer: Self-pay

## 2023-11-21 ENCOUNTER — Ambulatory Visit (HOSPITAL_COMMUNITY)
Admission: RE | Admit: 2023-11-21 | Discharge: 2023-11-21 | Disposition: A | Discharge status: Home / Self Care | Payer: Medicare Other | Attending: Gastroenterology | Admitting: Gastroenterology

## 2023-11-21 ENCOUNTER — Ambulatory Visit (HOSPITAL_COMMUNITY): Payer: Self-pay | Admitting: Anesthesiology

## 2023-11-21 ENCOUNTER — Ambulatory Visit (HOSPITAL_BASED_OUTPATIENT_CLINIC_OR_DEPARTMENT_OTHER): Payer: Self-pay | Admitting: Anesthesiology

## 2023-11-21 DIAGNOSIS — Z79899 Other long term (current) drug therapy: Secondary | ICD-10-CM | POA: Insufficient documentation

## 2023-11-21 DIAGNOSIS — D509 Iron deficiency anemia, unspecified: Secondary | ICD-10-CM | POA: Insufficient documentation

## 2023-11-21 DIAGNOSIS — C182 Malignant neoplasm of ascending colon: Secondary | ICD-10-CM

## 2023-11-21 DIAGNOSIS — K449 Diaphragmatic hernia without obstruction or gangrene: Secondary | ICD-10-CM | POA: Diagnosis not present

## 2023-11-21 DIAGNOSIS — K635 Polyp of colon: Secondary | ICD-10-CM

## 2023-11-21 DIAGNOSIS — K6389 Other specified diseases of intestine: Secondary | ICD-10-CM

## 2023-11-21 DIAGNOSIS — I10 Essential (primary) hypertension: Secondary | ICD-10-CM | POA: Insufficient documentation

## 2023-11-21 DIAGNOSIS — K319 Disease of stomach and duodenum, unspecified: Secondary | ICD-10-CM

## 2023-11-21 DIAGNOSIS — R6881 Early satiety: Secondary | ICD-10-CM | POA: Insufficient documentation

## 2023-11-21 DIAGNOSIS — K3189 Other diseases of stomach and duodenum: Secondary | ICD-10-CM | POA: Insufficient documentation

## 2023-11-21 DIAGNOSIS — R011 Cardiac murmur, unspecified: Secondary | ICD-10-CM | POA: Diagnosis not present

## 2023-11-21 DIAGNOSIS — D125 Benign neoplasm of sigmoid colon: Secondary | ICD-10-CM | POA: Insufficient documentation

## 2023-11-21 DIAGNOSIS — K219 Gastro-esophageal reflux disease without esophagitis: Secondary | ICD-10-CM | POA: Insufficient documentation

## 2023-11-21 DIAGNOSIS — K2970 Gastritis, unspecified, without bleeding: Secondary | ICD-10-CM

## 2023-11-21 DIAGNOSIS — K5669 Other partial intestinal obstruction: Secondary | ICD-10-CM | POA: Diagnosis not present

## 2023-11-21 HISTORY — PX: COLONOSCOPY WITH PROPOFOL: SHX5780

## 2023-11-21 HISTORY — PX: SUBMUCOSAL TATTOO INJECTION: SHX6856

## 2023-11-21 HISTORY — PX: ESOPHAGOGASTRODUODENOSCOPY (EGD) WITH PROPOFOL: SHX5813

## 2023-11-21 HISTORY — PX: BIOPSY: SHX5522

## 2023-11-21 LAB — HM COLONOSCOPY

## 2023-11-21 SURGERY — COLONOSCOPY WITH PROPOFOL
Anesthesia: General

## 2023-11-21 MED ORDER — PROPOFOL 10 MG/ML IV BOLUS
INTRAVENOUS | Status: DC | PRN
  Administered 2023-11-21: 50 mg via INTRAVENOUS
  Administered 2023-11-21: 100 mg via INTRAVENOUS

## 2023-11-21 MED ORDER — LIDOCAINE HCL (PF) 2 % IJ SOLN
INTRAMUSCULAR | Status: DC | PRN
  Administered 2023-11-21: 100 mg via INTRADERMAL

## 2023-11-21 MED ORDER — LACTATED RINGERS IV SOLN: INTRAVENOUS | Status: DC

## 2023-11-21 MED ORDER — SODIUM CHLORIDE 0.9% FLUSH: 3.0000 mL | INTRAVENOUS | Status: DC | PRN

## 2023-11-21 MED ORDER — PHENYLEPHRINE 80 MCG/ML (10ML) SYRINGE FOR IV PUSH (FOR BLOOD PRESSURE SUPPORT)
PREFILLED_SYRINGE | INTRAVENOUS | Status: AC
  Filled 2023-11-21: qty 10

## 2023-11-21 MED ORDER — PROPOFOL 500 MG/50ML IV EMUL
INTRAVENOUS | Status: DC | PRN
Start: 2023-11-21 — End: 2023-11-21
  Administered 2023-11-21: 100 ug/kg/min via INTRAVENOUS

## 2023-11-21 MED ORDER — SPOT INK MARKER SYRINGE KIT
PACK | SUBMUCOSAL | Status: DC | PRN
  Administered 2023-11-21: 5 mL via SUBMUCOSAL

## 2023-11-21 MED ORDER — PHENYLEPHRINE 80 MCG/ML (10ML) SYRINGE FOR IV PUSH (FOR BLOOD PRESSURE SUPPORT)
PREFILLED_SYRINGE | INTRAVENOUS | Status: DC | PRN
  Administered 2023-11-21: 80 ug via INTRAVENOUS
  Administered 2023-11-21: 160 ug via INTRAVENOUS
  Administered 2023-11-21: 80 ug via INTRAVENOUS

## 2023-11-21 MED ORDER — SODIUM CHLORIDE 0.9% FLUSH: 3.0000 mL | Freq: Two times a day (BID) | INTRAVENOUS | Status: DC

## 2023-11-21 MED ORDER — SODIUM CHLORIDE FLUSH 0.9 % IV SOLN
INTRAVENOUS | Status: AC
  Filled 2023-11-21: qty 10

## 2023-11-21 MED ORDER — SPOT INK MARKER SYRINGE KIT
PACK | SUBMUCOSAL | Status: AC
  Filled 2023-11-21: qty 5

## 2023-11-21 MED ORDER — SODIUM CHLORIDE 0.9% FLUSH
3.0000 mL | Freq: Two times a day (BID) | INTRAVENOUS | Status: DC
Start: 2023-11-21 — End: 2023-11-21

## 2023-11-21 NOTE — Anesthesia Procedure Notes (Signed)
 Date/Time: 11/21/2023 8:16 AM  Performed by: Julian Reil, CRNAPre-anesthesia Checklist: Patient identified, Emergency Drugs available, Suction available and Patient being monitored Patient Re-evaluated:Patient Re-evaluated prior to induction Oxygen Delivery Method: Nasal cannula Induction Type: IV induction Placement Confirmation: positive ETCO2 Comments: Optiflow High Flow [place] O2 used.

## 2023-11-21 NOTE — Telephone Encounter (Signed)
 CT scheduled for 11/29/23 at 11:00am at [facility]. Pt to arrive at 9 am to begin drinking contrast. Will send my chart message to pt.

## 2023-11-21 NOTE — Telephone Encounter (Signed)
-----   Message from Franky Macho sent at 11/21/2023  9:12 AM EST ----- Regarding: colon mass` Hi Kayla [name]  Can you please refer this patient 1) Oncology 2) Colorectal surgeon Diagnosis: Colon Mass

## 2023-11-21 NOTE — Op Note (Signed)
 [facility] Patient Name: Kayla [name] Procedure Date: 11/21/2023 8:12 AM MRN: [id number] Date of Birth: Oct 26, 1933 Attending MD: Sanjuan Dame , MD, [id number] CSN: [id number] Age: 89 Admit Type: Outpatient Procedure:                Upper GI endoscopy Indications:              Unexplained iron deficiency anemia, Early satiety Providers:                Sanjuan Dame, MD, Buel Ream. Thomasena Edis RN, RN,                            Pandora Leiter, Technician Referring MD:              Medicines:                Monitored Anesthesia Care Complications:            No immediate complications. Estimated Blood Loss:     Estimated blood loss: none. Procedure:                Pre-Anesthesia Assessment:                           - Prior to the procedure, a History and Physical                            was performed, and patient medications and                            allergies were reviewed. The patient's tolerance of                            previous anesthesia was also reviewed. The risks                            and benefits of the procedure and the sedation                            options and risks were discussed with the patient.                            All questions were answered, and informed consent                            was obtained. Prior Anticoagulants: The patient has                            taken no anticoagulant or antiplatelet agents. ASA                            Grade Assessment: II - A patient with mild systemic                            disease. After reviewing the risks and benefits,  the patient was deemed in satisfactory condition to                            undergo the procedure.                           After obtaining informed consent, the endoscope was                            passed under direct vision. Throughout the                            procedure, the patient's blood pressure, pulse, and                             oxygen saturations were monitored continuously. The                            [id number]-H190 ([id number]) scope was introduced through the                            mouth, and advanced to the second part of duodenum.                            The upper GI endoscopy was accomplished without                            difficulty. The patient tolerated the procedure                            well. Scope In: 8:23:47 AM Scope Out: 8:28:09 AM Total Procedure Duration: 0 hours 4 minutes 22 seconds  Findings:      The examined esophagus was normal.      A 2 cm hiatal hernia was present.      Mildly erythematous mucosa without bleeding was found in the gastric       antrum. Biopsies were taken with a cold forceps for histology.      The duodenal bulb and second portion of the duodenum were normal. Impression:               - Normal esophagus.                           - 2 cm hiatal hernia.                           - Erythematous mucosa in the antrum. Biopsied.                           - Normal duodenal bulb and second portion of the                            duodenum. Moderate Sedation:      Per Anesthesia Care Recommendation:           - Patient has a contact number available for  emergencies. The signs and symptoms of potential                            delayed complications were discussed with the                            patient. Return to normal activities tomorrow.                            Written discharge instructions were provided to the                            patient.                           - Resume previous diet.                           - Continue present medications.                           - Await pathology results. Procedure Code(s):        --- Professional ---                           [phone], Esophagogastroduodenoscopy, flexible,                            transoral; with biopsy, single or multiple Diagnosis Code(s):        ---  Professional ---                           K44.9, Diaphragmatic hernia without obstruction or                            gangrene                           K31.89, Other diseases of stomach and duodenum                           D50.9, Iron deficiency anemia, unspecified                           R68.81, Early satiety CPT copyright 2022 American Medical Association. All rights reserved. The codes documented in this report are preliminary and upon coder review may  be revised to meet current compliance requirements. Sanjuan Dame, MD Sanjuan Dame, MD 11/21/2023 9:03:18 AM This report has been signed electronically. Number of Addenda: 0

## 2023-11-21 NOTE — Op Note (Addendum)
 [facility] Patient Name: Kayla [name] Procedure Date: 11/21/2023 8:11 AM MRN: [id number] Date of Birth: 01/05/1934 Attending MD: Sanjuan Dame , MD, [id number] CSN: [id number] Age: 89 Admit Type: Outpatient Procedure:                Colonoscopy Indications:              Unexplained iron deficiency anemia, Abnormal CT of                            the GI tract Providers:                Sanjuan Dame, MD, Buel Ream. Thomasena Edis RN, RN,                            Pandora Leiter, Technician Referring MD:              Medicines:                Monitored Anesthesia Care Complications:            No immediate complications. Estimated Blood Loss:     Estimated blood loss was minimal. Procedure:                Pre-Anesthesia Assessment:                           - Prior to the procedure, a History and Physical                            was performed, and patient medications and                            allergies were reviewed. The patient's tolerance of                            previous anesthesia was also reviewed. The risks                            and benefits of the procedure and the sedation                            options and risks were discussed with the patient.                            All questions were answered, and informed consent                            was obtained. Prior Anticoagulants: The patient has                            taken no anticoagulant or antiplatelet agents. ASA                            Grade Assessment: II - A patient with mild systemic  disease. After reviewing the risks and benefits,                            the patient was deemed in satisfactory condition to                            undergo the procedure.                           After obtaining informed consent, the colonoscope                            was passed under direct vision. Throughout the                            procedure, the patient's  blood pressure, pulse, and                            oxygen saturations were monitored continuously. The                            [id number] scope was introduced through the                            anus and advanced to the the cecum, identified by                            its appearance. The colonoscopy was performed                            without difficulty. The patient tolerated the                            procedure well. The quality of the bowel                            preparation was evaluated using the BBPS [facility]                            Bowel Preparation Scale) with scores of: Right                            Colon = 3, Transverse Colon = 3 and Left Colon = 3                            (entire mucosa seen well with no residual staining,                            small fragments of stool or opaque liquid). The                            total BBPS score equals 9. The rectum was  photographed. Scope In: 8:34:55 AM Scope Out: 8:59:40 AM Scope Withdrawal Time: 0 hours 20 minutes 3 seconds  Total Procedure Duration: 0 hours 24 minutes 45 seconds  Findings:      The perianal and digital rectal examinations were normal.      A fungating and ulcerated partially obstructing large mass was found in       the ascending colon. The mass was circumferential. Oozing was present.       This was biopsied with a cold forceps for histology. Area was tattooed       with an injection of 5 mL of Spot (carbon black).      A 2 mm polyp was found in the sigmoid colon. The polyp was sessile. The       polyp was removed with a cold biopsy forceps. Resection and retrieval       were complete. Impression:               - Malignant partially obstructing tumor in the                            entire length of ascending colon. Biopsied.                            Tattooed distally to the mass.                           -Note cecum was visualized from far  endoscopically                            and was not intubated given friability and mass                            compressing against the cecum hence could be                            involving the cecum.                           - One 2 mm polyp in the sigmoid colon, removed with                            a cold biopsy forceps. Resected and retrieved. Moderate Sedation:      Per Anesthesia Care Recommendation:           - Patient has a contact number available for                            emergencies. The signs and symptoms of potential                            delayed complications were discussed with the                            patient. Return to normal activities tomorrow.  Written discharge instructions were provided to the                            patient.                           - Resume previous diet.                           - Continue present medications.                           - Await pathology results.                           - Repeat colonoscopy in 6 years for surveillance                            after surgery .                           - Return to GI clinic as previously scheduled.                           - Refer to a colo-rectal surgeon at appointment to                            be scheduled.                           - Refer to an oncologist at appointment to be                            scheduled.                           -Obtain staging CT with IV contrast Procedure Code(s):        --- Professional ---                           [phone], Colonoscopy, flexible; with biopsy, single                            or multiple                           45381, Colonoscopy, flexible; with directed                            submucosal injection(s), any substance Diagnosis Code(s):        --- Professional ---                           C18.2, Malignant neoplasm of ascending colon                           K56.690, Other  partial intestinal obstruction  D12.5, Benign neoplasm of sigmoid colon                           D50.9, Iron deficiency anemia, unspecified                           R93.3, Abnormal findings on diagnostic imaging of                            other parts of digestive tract CPT copyright 2022 American Medical Association. All rights reserved. The codes documented in this report are preliminary and upon coder review may  be revised to meet current compliance requirements. Sanjuan Dame, MD Sanjuan Dame, MD 11/21/2023 9:11:49 AM This report has been signed electronically. Number of Addenda: 0

## 2023-11-21 NOTE — Discharge Instructions (Signed)

## 2023-11-21 NOTE — Telephone Encounter (Signed)
 You're welcome!

## 2023-11-21 NOTE — Telephone Encounter (Signed)
 Both Referrals sent, they will contact patient with apt

## 2023-11-21 NOTE — Transfer of Care (Signed)
 Immediate Anesthesia Transfer of Care Note  Patient: Kayla [name] [facility]  Procedure(s) Performed: COLONOSCOPY WITH PROPOFOL ESOPHAGOGASTRODUODENOSCOPY (EGD) WITH PROPOFOL BIOPSY SUBMUCOSAL TATTOO INJECTION  Patient Location: Endoscopy Unit  Anesthesia Type:General  Level of Consciousness: drowsy  Airway & Oxygen Therapy: Patient Spontanous Breathing  Post-op Assessment: Report given to RN and Post -op Vital signs reviewed and stable  Post vital signs: Reviewed and stable  Last Vitals:  Vitals Value Taken Time  BP    Temp    Pulse    Resp    SpO2      Last Pain:  Vitals:   11/21/23 0822  TempSrc:   PainSc: 0-No pain      Patients Stated Pain Goal: 5 (11/21/23 0720)  Complications: No notable events documented.

## 2023-11-21 NOTE — Interval H&P Note (Signed)
 History and Physical Interval Note:  11/21/2023 8:04 AM  Kayla [name]  has presented today for surgery, with the diagnosis of IDA,wt loss,abnormal CT.  The various methods of treatment have been discussed with the patient and family. After consideration of risks, benefits and other options for treatment, the patient has consented to  Procedure(s) with comments: COLONOSCOPY WITH PROPOFOL (N/A) - 12:45 pm, asa 2 ESOPHAGOGASTRODUODENOSCOPY (EGD) WITH PROPOFOL (N/A) as a surgical intervention.  The patient's history has been reviewed, patient examined, no change in status, stable for surgery.  I have reviewed the patient's chart and labs.  Questions were answered to the patient's satisfaction.     Kayla [name]

## 2023-11-21 NOTE — Anesthesia Preprocedure Evaluation (Addendum)
 Anesthesia Evaluation  Patient identified by MRN, date of birth, ID band Patient awake    Reviewed: Allergy & Precautions, H&P , NPO status , Patient's Chart, lab work & pertinent test results, reviewed documented beta blocker date and time   Airway Mallampati: II  TM Distance: >3 FB Neck ROM: full    Dental  (+) Upper Dentures, Dental Advisory Given Lower bridge:   Pulmonary neg pulmonary ROS   Pulmonary exam normal breath sounds clear to auscultation       Cardiovascular Exercise Tolerance: Good hypertension,  Rhythm:regular Rate:Normal + Systolic murmurs Heart murmur   Neuro/Psych negative neurological ROS  negative psych ROS   GI/Hepatic Neg liver ROS,GERD  ,,  Endo/Other  negative endocrine ROS    Renal/GU negative Renal ROS  negative genitourinary   Musculoskeletal   Abdominal   Peds  Hematology  (+) Blood dyscrasia, anemia Hgb 8.7   Anesthesia Other Findings   Reproductive/Obstetrics negative OB ROS                             Anesthesia Physical Anesthesia Plan  ASA: 2  Anesthesia Plan: General   Post-op Pain Management: Minimal or no pain anticipated   Induction: Intravenous  PONV Risk Score and Plan: Propofol infusion  Airway Management Planned: Nasal Cannula and Natural Airway  Additional Equipment: None  Intra-op Plan:   Post-operative Plan:   Informed Consent: I have reviewed the patients History and Physical, chart, labs and discussed the procedure including the risks, benefits and alternatives for the proposed anesthesia with the patient or authorized representative who has indicated his/her understanding and acceptance.     Dental Advisory Given  Plan Discussed with: CRNA  Anesthesia Plan Comments:         Anesthesia Quick Evaluation

## 2023-11-21 NOTE — Telephone Encounter (Signed)
 Ahmed, Juanetta Beets, MD  Marlowe Shores, LPN Hi Kenney Houseman,  Can you please schedule a CT chest abdomen and pelvis? Dx: colon cancer  Thanks,  Vista Lawman, MD Gastroenterology and Hepatology [facility]

## 2023-11-21 NOTE — Anesthesia Postprocedure Evaluation (Signed)
 Anesthesia Post Note  Patient: SHAKILA MAK  Procedure(s) Performed: COLONOSCOPY WITH PROPOFOL ESOPHAGOGASTRODUODENOSCOPY (EGD) WITH PROPOFOL BIOPSY SUBMUCOSAL TATTOO INJECTION  Patient location during evaluation: PACU Anesthesia Type: General Level of consciousness: awake and alert Pain management: pain level controlled Vital Signs Assessment: post-procedure vital signs reviewed and stable Respiratory status: spontaneous breathing, nonlabored ventilation, respiratory function stable and patient connected to nasal cannula oxygen Cardiovascular status: blood pressure returned to baseline and stable Postop Assessment: no apparent nausea or vomiting Anesthetic complications: no   There were no known notable events for this encounter.   Last Vitals:  Vitals:   11/21/23 0902 11/21/23 0907  BP: (!) 123/48 (!) 105/48  Pulse: 68 64  Resp: 17 19  Temp: 36.7 C   SpO2: 97% 97%    Last Pain:  Vitals:   11/21/23 0909  TempSrc:   PainSc: 0-No pain                 Breckyn Troyer L Jerrion Tabbert

## 2023-11-22 ENCOUNTER — Encounter (HOSPITAL_COMMUNITY): Payer: Self-pay | Admitting: Gastroenterology

## 2023-11-22 ENCOUNTER — Encounter: Payer: Medicare Other | Admitting: Internal Medicine

## 2023-11-22 ENCOUNTER — Encounter (INDEPENDENT_AMBULATORY_CARE_PROVIDER_SITE_OTHER): Payer: Self-pay | Admitting: *Deleted

## 2023-11-23 NOTE — Progress Notes (Signed)
 Spoke to the patient over the phone today in detail and she verbalizes that the biopsies showed colon cancer. She also verbalizes understanding of getting a staging CT and follow up with Hematology which is already scheduled. Patient is also referred to colorectal surgeons

## 2023-11-28 ENCOUNTER — Ambulatory Visit: Payer: Self-pay | Admitting: General Surgery

## 2023-11-28 DIAGNOSIS — C182 Malignant neoplasm of ascending colon: Secondary | ICD-10-CM | POA: Diagnosis not present

## 2023-11-28 NOTE — H&P (Signed)
 REFERRING PHYSICIAN:  Franky Macho, MD  PROVIDER:  Elenora Gamma, MD  MRN: [id number] DOB: [birth year] DATE OF ENCOUNTER: 11/28/2023  Subjective   Chief Complaint: No chief complaint on file.     History of Present Illness: Kayla [name] is a 89 y.o. female who is seen today as an office consultation at the request of Dr. Tasia Catchings for evaluation of No chief complaint on file. .  Patient underwent a colonoscopy for iron deficiency anemia and abnormal CT that was performed for right lower quadrant pain.  This showed a fungating ulcerated partially obstructive large mass in the ascending colon.  The mass was biopsied and tattooed.  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  She is scheduled for CT scans of the chest abdomen pelvis with contrast tomorrow to complete her metastatic workup.  She has no major medical problems and no major surgical history.  Review of Systems: A complete review of systems was obtained from the patient.  I have reviewed this information and discussed as appropriate with the patient.  See HPI as well for other ROS.    Medical History: History reviewed. No pertinent past medical history.  There is no problem list on file for this patient.   History reviewed. No pertinent surgical history.   Not on File  No current outpatient medications on file prior to visit.   No current facility-administered medications on file prior to visit.    History reviewed. No pertinent family history.   Social History   Tobacco Use  Smoking Status Not on file  Smokeless Tobacco Not on file     Social History   Socioeconomic History   Marital status: Widowed   Social Drivers of Corporate investment banker Strain: Low Risk  (11/08/2023)   Received from [facility]   Overall Financial Resource Strain (CARDIA)    Difficulty of Paying Living Expenses: Not hard at all  Food Insecurity: Unknown (10/27/2023)   Received  from [facility]   Hunger Vital Sign    Ran Out of Food in the Last Year: Never true  Transportation Needs: No Transportation Needs (10/27/2023)   Received from [facility] - Transportation    Lack of Transportation (Medical): No    Lack of Transportation (Non-Medical): No  Physical Activity: Insufficiently Active (10/27/2023)   Received from [facility]   Exercise Vital Sign    Days of Exercise per Week: 3 days    Minutes of Exercise per Session: 30 min  Stress: No Stress Concern Present (10/27/2023)   Received from [facility] - Occupational Stress Questionnaire    Feeling of Stress : Not at all  Social Connections: Unknown (10/27/2023)   Received from [facility]   Social Connection and Isolation Panel [NHANES]    Frequency of Social Gatherings with Friends and Family: More than three times a week    Attends Religious Services: More than 4 times per year    Marital Status: Widowed  Housing Stability: Unknown (11/28/2023)   Housing Stability Vital Sign    Homeless in the Last Year: No    Objective:    There were no vitals filed for this visit.   Exam Gen: NAD Abd: soft    Labs, Imaging and Diagnostic Testing: CT reviewed.  Mass noted in the distal descending colon and appears to be invading the lateral sidewall.  Assessment and Plan:  Diagnoses and all orders for this visit:  Colon cancer, ascending (CMS/HHS-HCC)    89 year old healthy female who presents to the office for evaluation of a new diagnosis of ascending colon cancer.  She appears to have a large mass that may be invading her abdominal wall.  This appears to be resectable.  She will undergo complete CT scans tomorrow.  I have recommended an urgent colectomy to avoid issues with further anemia and possible obstruction.  The surgery and anatomy were described to the patient as well as the risks of surgery and the possible complications.  These include:  Bleeding, deep abdominal infections and possible wound complications such as hernia and infection, damage to adjacent structures, leak of surgical connections, which can lead to other surgeries and possibly an ostomy, possible need for other procedures, such as abscess drains in radiology, possible prolonged hospital stay, possible diarrhea from removal of part of the colon, possible constipation from narcotics, possible bowel, bladder or sexual dysfunction if having rectal surgery, prolonged fatigue/weakness or appetite loss, possible early recurrence of of disease, possible complications of their medical problems such as heart disease or arrhythmias or lung problems, death (less than 1%). I believe the patient understands and wishes to proceed with the surgery.    Vanita Panda, MD Colon and Rectal Surgery [facility]

## 2023-11-28 NOTE — H&P (View-Only) (Signed)
 REFERRING PHYSICIAN:  Franky Macho, MD  PROVIDER:  Elenora Gamma, MD  MRN: [id number] DOB: [birth year] DATE OF ENCOUNTER: 11/28/2023  Subjective   Chief Complaint: No chief complaint on file.     History of Present Illness: Kayla [name] is a 89 y.o. female who is seen today as an office consultation at the request of Dr. Tasia Catchings for evaluation of No chief complaint on file. .  Patient underwent a colonoscopy for iron deficiency anemia and abnormal CT that was performed for right lower quadrant pain.  This showed a fungating ulcerated partially obstructive large mass in the ascending colon.  The mass was biopsied and tattooed.  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  She is scheduled for CT scans of the chest abdomen pelvis with contrast tomorrow to complete her metastatic workup.  She has no major medical problems and no major surgical history.  Review of Systems: A complete review of systems was obtained from the patient.  I have reviewed this information and discussed as appropriate with the patient.  See HPI as well for other ROS.    Medical History: History reviewed. No pertinent past medical history.  There is no problem list on file for this patient.   History reviewed. No pertinent surgical history.   Not on File  No current outpatient medications on file prior to visit.   No current facility-administered medications on file prior to visit.    History reviewed. No pertinent family history.   Social History   Tobacco Use  Smoking Status Not on file  Smokeless Tobacco Not on file     Social History   Socioeconomic History   Marital status: Widowed   Social Drivers of Corporate investment banker Strain: Low Risk  (11/08/2023)   Received from [facility]   Overall Financial Resource Strain (CARDIA)    Difficulty of Paying Living Expenses: Not hard at all  Food Insecurity: Unknown (10/27/2023)   Received  from [facility]   Hunger Vital Sign    Ran Out of Food in the Last Year: Never true  Transportation Needs: No Transportation Needs (10/27/2023)   Received from [facility] - Transportation    Lack of Transportation (Medical): No    Lack of Transportation (Non-Medical): No  Physical Activity: Insufficiently Active (10/27/2023)   Received from [facility]   Exercise Vital Sign    Days of Exercise per Week: 3 days    Minutes of Exercise per Session: 30 min  Stress: No Stress Concern Present (10/27/2023)   Received from [facility] - Occupational Stress Questionnaire    Feeling of Stress : Not at all  Social Connections: Unknown (10/27/2023)   Received from [facility]   Social Connection and Isolation Panel [NHANES]    Frequency of Social Gatherings with Friends and Family: More than three times a week    Attends Religious Services: More than 4 times per year    Marital Status: Widowed  Housing Stability: Unknown (11/28/2023)   Housing Stability Vital Sign    Homeless in the Last Year: No    Objective:    There were no vitals filed for this visit.   Exam Gen: NAD Abd: soft    Labs, Imaging and Diagnostic Testing: CT reviewed.  Mass noted in the distal descending colon and appears to be invading the lateral sidewall.  Assessment and Plan:  Diagnoses and all orders for this visit:  Colon cancer, ascending (CMS/HHS-HCC)    89 year old healthy female who presents to the office for evaluation of a new diagnosis of ascending colon cancer.  She appears to have a large mass that may be invading her abdominal wall.  This appears to be resectable.  She will undergo complete CT scans tomorrow.  I have recommended an urgent colectomy to avoid issues with further anemia and possible obstruction.  The surgery and anatomy were described to the patient as well as the risks of surgery and the possible complications.  These include:  Bleeding, deep abdominal infections and possible wound complications such as hernia and infection, damage to adjacent structures, leak of surgical connections, which can lead to other surgeries and possibly an ostomy, possible need for other procedures, such as abscess drains in radiology, possible prolonged hospital stay, possible diarrhea from removal of part of the colon, possible constipation from narcotics, possible bowel, bladder or sexual dysfunction if having rectal surgery, prolonged fatigue/weakness or appetite loss, possible early recurrence of of disease, possible complications of their medical problems such as heart disease or arrhythmias or lung problems, death (less than 1%). I believe the patient understands and wishes to proceed with the surgery.    Vanita Panda, MD Colon and Rectal Surgery [facility]

## 2023-11-29 ENCOUNTER — Ambulatory Visit (HOSPITAL_COMMUNITY)
Admission: RE | Admit: 2023-11-29 | Discharge: 2023-11-29 | Disposition: A | Discharge status: Home / Self Care | Payer: Medicare Other | Source: Ambulatory Visit | Attending: Gastroenterology | Admitting: Gastroenterology

## 2023-11-29 ENCOUNTER — Telehealth (INDEPENDENT_AMBULATORY_CARE_PROVIDER_SITE_OTHER): Payer: Self-pay | Admitting: Gastroenterology

## 2023-11-29 DIAGNOSIS — K449 Diaphragmatic hernia without obstruction or gangrene: Secondary | ICD-10-CM | POA: Diagnosis not present

## 2023-11-29 DIAGNOSIS — K6389 Other specified diseases of intestine: Secondary | ICD-10-CM | POA: Insufficient documentation

## 2023-11-29 DIAGNOSIS — C182 Malignant neoplasm of ascending colon: Secondary | ICD-10-CM | POA: Diagnosis not present

## 2023-11-29 DIAGNOSIS — K5730 Diverticulosis of large intestine without perforation or abscess without bleeding: Secondary | ICD-10-CM | POA: Diagnosis not present

## 2023-11-29 DIAGNOSIS — I700 Atherosclerosis of aorta: Secondary | ICD-10-CM | POA: Diagnosis not present

## 2023-11-29 DIAGNOSIS — C189 Malignant neoplasm of colon, unspecified: Secondary | ICD-10-CM | POA: Diagnosis not present

## 2023-11-29 MED ORDER — IOHEXOL 300 MG/ML  SOLN
100.0000 mL | Freq: Once | INTRAMUSCULAR | Status: AC | PRN
  Administered 2023-11-29: 100 mL via INTRAVENOUS

## 2023-11-29 MED ORDER — IOHEXOL 9 MG/ML PO SOLN
500.0000 mL | ORAL | Status: AC
  Administered 2023-11-29: 500 mL via ORAL

## 2023-11-29 MED ORDER — IOHEXOL 9 MG/ML PO SOLN
ORAL | Status: AC
  Filled 2023-11-29: qty 1000

## 2023-11-29 NOTE — Telephone Encounter (Signed)
 Patient seen by Colorectal Surgery :

## 2023-11-30 ENCOUNTER — Encounter: Payer: Self-pay | Admitting: Internal Medicine

## 2023-11-30 ENCOUNTER — Inpatient Hospital Stay: Payer: Medicare Other

## 2023-11-30 ENCOUNTER — Ambulatory Visit (INDEPENDENT_AMBULATORY_CARE_PROVIDER_SITE_OTHER): Payer: Medicare Other | Admitting: Internal Medicine

## 2023-11-30 ENCOUNTER — Inpatient Hospital Stay: Payer: Medicare Other | Attending: Oncology | Admitting: Oncology

## 2023-11-30 VITALS — BP 139/44 | HR 96 | Temp 98.2°F | Resp 19 | Ht 69.0 in | Wt 158.0 lb

## 2023-11-30 VITALS — BP 132/58 | HR 94 | Ht 69.0 in | Wt 159.4 lb

## 2023-11-30 DIAGNOSIS — I10 Essential (primary) hypertension: Secondary | ICD-10-CM

## 2023-11-30 DIAGNOSIS — C182 Malignant neoplasm of ascending colon: Secondary | ICD-10-CM | POA: Insufficient documentation

## 2023-11-30 DIAGNOSIS — D500 Iron deficiency anemia secondary to blood loss (chronic): Secondary | ICD-10-CM

## 2023-11-30 DIAGNOSIS — Z0001 Encounter for general adult medical examination with abnormal findings: Secondary | ICD-10-CM | POA: Diagnosis not present

## 2023-11-30 DIAGNOSIS — N1831 Chronic kidney disease, stage 3a: Secondary | ICD-10-CM

## 2023-11-30 DIAGNOSIS — D509 Iron deficiency anemia, unspecified: Secondary | ICD-10-CM | POA: Insufficient documentation

## 2023-11-30 NOTE — Assessment & Plan Note (Signed)
 Annual physical completed today.  Previous records and labs reviewed. -No additional labs ordered today.  Labs completed in early February and repeat labs are scheduled for next Monday (3/21). -Preventative care items are up-to-date -She will undergo partial colectomy on 3/21 in the setting of recently diagnosed adenocarcinoma of the ascending colon -We will tentatively plan for follow-up in 3 months

## 2023-11-30 NOTE — Assessment & Plan Note (Signed)
 Iron deficiency anemia likely secondary to GI bleed.  Significantly low iron levels. -The most likely cause of her anemia is due to chronic blood loss/malabsorption syndrome.We discussed some of the risks, benefits, and alternatives of intravenous iron infusions. The patient is symptomatic from anemia and the iron level is critically low. She desires to achieve higher levels of iron faster for adequate hematopoesis. Some of the side-effects to be expected including risks of infusion reactions, phlebitis, headaches, nausea and fatigue.  The patient is willing to proceed. Patient education material was dispensed.  Goal is to keep ferritin level greater than 50 and resolution of anemia -Resume oral iron supplements every other day postsurgery  Will recheck labs on return to clinic.

## 2023-11-30 NOTE — Assessment & Plan Note (Signed)
 Noted on recent labs.  Secondary to recently diagnosed adenocarcinoma of the ascending colon.  Currently on oral iron supplementation and will receive an iron infusion next Monday (3/10).  Repeat labs scheduled for Monday 3/10 as well.

## 2023-11-30 NOTE — Patient Instructions (Addendum)
 [facility]  Discharge Instructions  You were seen and examined today by Dr. Anders Simmonds. Dr. Anders Simmonds is a medical oncologist, meaning that he specializes in the treatment of cancer diagnoses. Dr. Anders Simmonds discussed your past medical history, family history of cancers, and the events that led to you being here today.  You were referred to Dr. Anders Simmonds due to a new diagnosis of Colon Cancer.  Dr. Anders Simmonds has reviewed your CT scan from yesterday which revealed no spread of the cancer to other organs within your body. At this point, this is most likely a Stage III which will be confirmed with surgery. This is treated with surgery followed by chemotherapy.  Please proceed with your surgery as scheduled.  Your iron levels are low. We would like to give you IV iron to help build your iron back up prior to your surgery.  Follow-up with Dr. Anders Simmonds two weeks following surgery.  Thank you for choosing [facility] to provide your oncology and hematology care.   To afford each patient quality time with our provider, please arrive at least 15 minutes before your scheduled appointment time. You may need to reschedule your appointment if you arrive late (10 or more minutes). Arriving late affects you and other patients whose appointments are after yours.  Also, if you miss three or more appointments without notifying the office, you may be dismissed from the clinic at the provider's discretion.    Again, thank you for choosing [facility].  Our hope is that these requests will decrease the amount of time that you wait before being seen by our physicians.   If you have a lab appointment with the Cancer Center - please note that after April 8th, all labs will be drawn in the cancer center.  You do not have to check in or register with the main entrance as you have in the past but will complete your check-in at the cancer center.             _____________________________________________________________  Should you have questions after your visit to [facility], please contact our office at [phone] and follow the prompts.  Our office hours are 8:00 a.m. to 4:30 p.m. Monday - Thursday and 8:00 a.m. to 2:30 p.m. Friday.  Please note that voicemails left after 4:00 p.m. may not be returned until the following business day.  We are closed weekends and all major holidays.  You do have access to a nurse 24-7, just call the main number to the clinic [phone] and do not press any options, hold on the line and a nurse will answer the phone.    For prescription refill requests, have your pharmacy contact our office and allow 72 hours.    Masks are no longer required in the cancer centers. If you would like for your care team to wear a mask while they are taking care of you, please let them know. You may have one support person who is at least 88 years old accompany you for your appointments.

## 2023-11-30 NOTE — Assessment & Plan Note (Addendum)
 Newly diagnosed, right sided colonic mass.  No evidence of metastasis on CT scan.  Surgery appointment scheduled from 21st March.Discussed potential need for chemotherapy post-surgery, depending on the pathological findings and stage. - Proceed with scheduled surgery. - Post-surgery, review pathological findings to determine stage and need for chemotherapy. -Will send MSI testing on path specimen  Plan to see the patient 2-4 weeks post-surgery to discuss surgical and pathological findings, and to determine the need for further treatment.

## 2023-11-30 NOTE — Progress Notes (Signed)
 Complete physical exam  Patient: Kayla [name]   DOB: [birth year]   89 y.o. Female  MRN: [id number]  Subjective:    Chief Complaint  Patient presents with   Annual Exam    Kayla [name] is a 89 y.o. female who presents today for a complete physical exam. She reports consuming a general diet. Gym/ health club routine includes water aerobics. She generally feels fairly well. She reports sleeping fairly well. She does have additional problems to discuss today. She has recently been diagnosed with colon cancer and will undergo partial colectomy on 3/21. She established care with oncology (Dr. Anders Simmonds) earlier today.    Most recent fall risk assessment:    11/30/2023    1:51 PM  Fall Risk   Falls in the past year? 0  Number falls in past yr: 0  Injury with Fall? 0  Risk for fall due to : No Fall Risks  Follow up Falls evaluation completed     Most recent depression screenings:    11/30/2023    1:50 PM 10/31/2023    8:50 AM  PHQ 2/9 Scores  PHQ - 2 Score 0 0  PHQ- 9 Score 0 0    Vision:Not within last year  and Dental: No current dental problems and Receives regular dental care  Past Medical History:  Diagnosis Date   GERD (gastroesophageal reflux disease)    Heart murmur    Hypertension    Past Surgical History:  Procedure Laterality Date   BIOPSY  11/21/2023   Procedure: BIOPSY;  Surgeon: Franky Macho, MD;  [facility]: [facility];  Service: Endoscopy;;   COLONOSCOPY WITH PROPOFOL N/A 11/21/2023   Procedure: COLONOSCOPY WITH PROPOFOL;  Surgeon: Franky Macho, MD;  Location: [facility];  Service: Endoscopy;  Laterality: N/A;  12:45 pm, asa 2   ESOPHAGOGASTRODUODENOSCOPY (EGD) WITH PROPOFOL N/A 11/21/2023   Procedure: ESOPHAGOGASTRODUODENOSCOPY (EGD) WITH PROPOFOL;  Surgeon: Franky Macho, MD;  [facility]: [facility];  Service: Endoscopy;  Laterality: N/A;   EYE SURGERY     SUBMUCOSAL TATTOO INJECTION  11/21/2023   Procedure: SUBMUCOSAL TATTOO INJECTION;   Surgeon: Franky Macho, MD;  Location: [facility];  Service: Endoscopy;;   Social History   Tobacco Use   Smoking status: Never   Smokeless tobacco: Never  Vaping Use   Vaping status: Never Used  Substance Use Topics   Alcohol use: Never   Drug use: Never   History reviewed. No pertinent family history. No Known Allergies   Patient Care Team: Billie Lade, MD as PCP - General (Internal Medicine) Cindie Crumbly, MD as Medical Oncologist (Medical Oncology) Therese Sarah, RN as Oncology Nurse Navigator (Medical Oncology)   Outpatient Medications Prior to Visit  Medication Sig   aspirin EC 81 MG tablet Take 1 tablet by mouth daily.   famotidine (PEPCID) 20 MG tablet TAKE 1 TABLET BY MOUTH TWICE  DAILY   Fish Oil-Cholecalciferol (FISH OIL + D3) 1000-1000 MG-UNIT CAPS Take 2 capsules by mouth daily.   GAVILYTE-G 236 g solution Take 4,000 mLs by mouth once.   Iron, Ferrous Sulfate, 325 (65 Fe) MG TABS Take 325 mg by mouth daily.   metroNIDAZOLE (FLAGYL) 500 MG tablet Take 1,000 mg by mouth 3 (three) times daily.   Multiple Vitamin (MULTIVITAMIN) tablet Take 1 tablet by mouth daily.   neomycin (MYCIFRADIN) 500 MG tablet Take 1,000 mg by mouth 3 (three) times daily.   ondansetron (ZOFRAN) 4 MG tablet  Take by mouth.   valsartan (DIOVAN) 160 MG tablet Take 1 tablet (160 mg total) by mouth daily.   No facility-administered medications prior to visit.   Review of Systems  Constitutional:  Negative for chills and fever.  HENT:  Negative for sore throat.   Respiratory:  Negative for cough and shortness of breath.   Cardiovascular:  Negative for chest pain, palpitations and leg swelling.  Gastrointestinal:  Negative for abdominal pain, blood in stool, constipation, diarrhea, nausea and vomiting.  Genitourinary:  Negative for dysuria and hematuria.  Musculoskeletal:  Negative for myalgias.  Skin:  Negative for itching and rash.  Neurological:  Negative for dizziness and  headaches.  Psychiatric/Behavioral:  Negative for depression and suicidal ideas.        Objective:     BP (!) 132/58   Pulse 94   Ht 5\' 9"  (1.753 m)   Wt 159 lb 6.4 oz (72.3 kg)   SpO2 96%   BMI 23.54 kg/m  BP Readings from Last 3 Encounters:  11/30/23 (!) 132/58  11/30/23 (!) 139/44  11/21/23 (!) 105/48   Physical Exam Vitals reviewed.  Constitutional:      General: She is not in acute distress.    Appearance: Normal appearance. She is not toxic-appearing.  HENT:     Head: Normocephalic and atraumatic.     Right Ear: External ear normal.     Left Ear: External ear normal.     Nose: Nose normal. No congestion or rhinorrhea.     Mouth/Throat:     Mouth: Mucous membranes are moist.     Pharynx: Oropharynx is clear. No oropharyngeal exudate or posterior oropharyngeal erythema.  Eyes:     General: No scleral icterus.    Extraocular Movements: Extraocular movements intact.     Conjunctiva/sclera: Conjunctivae normal.     Pupils: Pupils are equal, round, and reactive to light.  Cardiovascular:     Rate and Rhythm: Normal rate and regular rhythm.     Pulses: Normal pulses.     Heart sounds: Normal heart sounds. No murmur heard.    No friction rub. No gallop.  Pulmonary:     Effort: Pulmonary effort is normal.     Breath sounds: Normal breath sounds. No wheezing, rhonchi or rales.  Abdominal:     General: Bowel sounds are normal. There is no distension.     Palpations: Abdomen is soft. There is mass (Palpable, firm mass in right lower abdominal quadrant, mildly tender).     Tenderness: There is no abdominal tenderness.  Musculoskeletal:        General: No swelling. Normal range of motion.     Cervical back: Normal range of motion.     Right lower leg: No edema.     Left lower leg: No edema.  Lymphadenopathy:     Cervical: No cervical adenopathy.  Skin:    General: Skin is warm and dry.     Capillary Refill: Capillary refill takes less than 2 seconds.      Coloration: Skin is not jaundiced.  Neurological:     General: No focal deficit present.     Mental Status: She is alert and oriented to person, place, and time.  Psychiatric:        Mood and Affect: Mood normal.        Behavior: Behavior normal.   Last CBC Lab Results  Component Value Date   WBC 9.7 11/08/2023   HGB 8.7 (L) 11/18/2023   HCT 28.5 (  L) 11/18/2023   MCV 80 11/08/2023   MCH 24.8 (L) 11/08/2023   RDW 12.3 11/08/2023   PLT 409 11/08/2023   Last metabolic panel Lab Results  Component Value Date   GLUCOSE 110 (H) 10/31/2023   NA 140 10/31/2023   K 5.1 10/31/2023   CL 100 10/31/2023   CO2 23 10/31/2023   BUN 18 10/31/2023   CREATININE 0.84 10/31/2023   EGFR 67 10/31/2023   CALCIUM 8.9 10/31/2023   PROT 6.0 10/31/2023   ALBUMIN 3.7 10/31/2023   LABGLOB 2.3 10/31/2023   AGRATIO 1.6 10/18/2022   BILITOT <0.2 10/31/2023   ALKPHOS 100 10/31/2023   AST 17 10/31/2023   ALT 10 10/31/2023   ANIONGAP 7 08/02/2019   Last lipids Lab Results  Component Value Date   CHOL 202 (H) 10/31/2023   HDL 45 10/31/2023   LDLCALC 136 (H) 10/31/2023   TRIG 114 10/31/2023   CHOLHDL 4.5 (H) 10/31/2023   Last hemoglobin A1c Lab Results  Component Value Date   HGBA1C 5.6 10/18/2022   Last thyroid functions Lab Results  Component Value Date   TSH 2.200 10/31/2023   Last vitamin D Lab Results  Component Value Date   VD25OH 51.8 10/31/2023   Last vitamin B12 and Folate Lab Results  Component Value Date   VITAMINB12 707 10/31/2023   FOLATE >20.0 10/31/2023        Assessment & Plan:    Routine Health Maintenance and Physical Exam  Immunization History  Administered Date(s) Administered   Fluad Quad(high Dose 65+) 07/26/2022   Fluad Trivalent(High Dose 65+) 06/08/2023   Influenza-Unspecified 06/18/2021   Moderna Sars-Covid-2 Vaccination 11/14/2019, 12/12/2019, 08/16/2020, 07/03/2021   PNEUMOCOCCAL CONJUGATE-20 10/23/2021   Pneumococcal Conjugate-13 05/23/2017    Tdap 11/18/2022   Zoster Recombinant(Shingrix) 11/26/2021, 01/25/2022    Health Maintenance  Topic Date Due   Medicare Annual Wellness (AWV)  11/07/2024   DTaP/Tdap/Td (2 - Td or Tdap) 11/18/2032   Pneumonia Vaccine 25+ Years old  Completed   INFLUENZA VACCINE  Completed   DEXA SCAN  Completed   Zoster Vaccines- Shingrix  Completed   HPV VACCINES  Aged Out   COVID-19 Vaccine  Discontinued    Discussed health benefits of physical activity, and encouraged her to engage in regular exercise appropriate for her age and condition.  Problem List Items Addressed This Visit       Hypertension - Primary   Remains adequately controlled with valsartan 160 mg daily.  No medication changes are indicated today.      Malignant neoplasm of ascending colon [facility])   Recently diagnosed with adenocarcinoma of the ascending colon after presenting for an acute visit in early February endorsing right lower quadrant abdominal pain with palpable mass noted on exam.  Labs revealed iron deficiency anemia.  CT scan showed a large, irregular mass in the ascending colon concerning for colonic neoplasm.  She was referred to GI and underwent colonoscopy on 2/24, which revealed a malignant, partially obstructing tumor in the entire length of the ascending colon.  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  She was referred to colorectal surgery and will undergo partial colectomy on 3/21.  She established care with oncology earlier today.  Additional treatment is pending pathology results from surgery.      CKD stage 3a, GFR 45-59 ml/min (HCC)   Renal function stable on recent labs.  Remains on ARB.      Encounter for well adult exam with abnormal findings  Annual physical completed today.  Previous records and labs reviewed. -No additional labs ordered today.  Labs completed in early February and repeat labs are scheduled for next Monday (3/21). -Preventative care items  are up-to-date -She will undergo partial colectomy on 3/21 in the setting of recently diagnosed adenocarcinoma of the ascending colon -We will tentatively plan for follow-up in 3 months      IDA (iron deficiency anemia)   Noted on recent labs.  Secondary to recently diagnosed adenocarcinoma of the ascending colon.  Currently on oral iron supplementation and will receive an iron infusion next Monday (3/10).  Repeat labs scheduled for Monday 3/10 as well.      Return in about 3 months (around 03/01/2024).  Billie Lade, MD

## 2023-11-30 NOTE — Patient Instructions (Signed)
 It was a pleasure to see you today.  Thank you for giving [place] the opportunity to be involved in your care.  Below is a brief recap of your visit and next steps.  We will plan to see you again in 3 months.  Summary Annual physical completed today We will follow up in 3 months Best of luck with your upcoming surgery. Please let us know if anything is needed.

## 2023-11-30 NOTE — Assessment & Plan Note (Signed)
 Renal function stable on recent labs.  Remains on ARB.

## 2023-11-30 NOTE — Assessment & Plan Note (Signed)
 Recently diagnosed with adenocarcinoma of the ascending colon after presenting for an acute visit in early February endorsing right lower quadrant abdominal pain with palpable mass noted on exam.  Labs revealed iron deficiency anemia.  CT scan showed a large, irregular mass in the ascending colon concerning for colonic neoplasm.  She was referred to GI and underwent colonoscopy on 2/24, which revealed a malignant, partially obstructing tumor in the entire length of the ascending colon.  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  She was referred to colorectal surgery and will undergo partial colectomy on 3/21.  She established care with oncology earlier today.  Additional treatment is pending pathology results from surgery.

## 2023-11-30 NOTE — Progress Notes (Signed)
 Hematology-Oncology Clinic Note  Billie Lade, MD   Reason for Referral: Colon adenocarcinoma  Oncology History: I have reviewed her chart and materials related to her cancer extensively and collaborated history with the patient. Summary of oncologic history is as follows:  Oncology History  Malignant neoplasm of ascending colon (HCC)  11/11/2023 Imaging   CT A&P wo contrast:  Large irregular mass lesion in the ascending colon as described consistent with colonic neoplasm till proven otherwise.    11/21/2023 Initial Diagnosis   Malignant neoplasm of ascending colon (HCC)   11/21/2023 Procedure   Colonoscopy:  Malignant partially obstructing tumor in the entire length of ascending colon. Note cecum was visualized from far endoscopically and was not intubated given friability and mass compressing against the cecum hence could be involving the cecum.  - One 2 mm polyp in the sigmoid colon, removed with a cold biopsy forceps. Resected and retrieved.   11/21/2023 Pathology Results   COLON, ASCENDING MASS, BIOPSY:  Poorly differentiated adenocarcinoma with signet ring cells.   COLON, SIGMOID, POLYPECTOMY: Hyperplastic polyp.    11/29/2023 Imaging   CT CAP w contrast:  1. Short segment irregular wall thickening of the ascending colon with adjacent lobular fat stranding, compatible with known primary colonic neoplasm. 2. Enlarged ileocolic lymph nodes measure up to 11 mm in short axis, compatible for nodal disease involvement. 3. Prominent lymph nodes along the mesenteric root and retroperitoneum are nonspecific and may be reactive or reflect nodal disease involvement. 4. No evidence of metastatic disease in the chest. 5. Stable 11 mm ground-glass pulmonary nodule in the right upper lobe, suggest continued attention on follow-up imaging.      History of Presenting Illness: Kayla [name] 89 y.o. female is here because of newly diagnosed colon adenocarcinoma.  Patient has a  past medical history of hypertension, GERD.  She is accompanied by her 2 daughters today.  Patient was of good health until December of last year.  It is during that time that she noticed that she started to lose some weight and initially attributed it to physical activity.  She did not reported an episode of what she thought was food poisoning that resulted in nausea, vomiting and abdominal pain.  Nausea and vomiting resolved but abdominal pain persisted.  She got evaluated by her primary care for the same and got a CT done which showed a probable colonic mass.  She was then referred to GI and had a colonoscopy done with Dr. Tasia Catchings showing colonic mass, biopsy of which proved colonic adenocarcinoma.  She then had a CT done for staging.  Patient has an appointment with GI surgeon on 12/16/2023.  Apart from abdominal pain, weight loss patient does feel well.  She is still independent and lives by herself in [place].  She denies fatigue, shortness of breath, dyspnea on exertion, fever, chills.  She denies constipation, blood in her stools or melena.  Overall she is doing well.  Patient is a non-smoker, nonalcoholic.  Lives in [place] by herself and is completely independent.  Daughters live close by.  She has no family history of cancer.   Medical History: Past Medical History:  Diagnosis Date   GERD (gastroesophageal reflux disease)    Heart murmur    Hypertension     Surgical history: Past Surgical History:  Procedure Laterality Date   BIOPSY  11/21/2023   Procedure: BIOPSY;  Surgeon: Franky Macho, MD;  Location: [facility];  Service: Endoscopy;;   COLONOSCOPY WITH  PROPOFOL N/A 11/21/2023   Procedure: COLONOSCOPY WITH PROPOFOL;  Surgeon: Franky Macho, MD;  Location: [facility];  Service: Endoscopy;  Laterality: N/A;  12:45 pm, asa 2   ESOPHAGOGASTRODUODENOSCOPY (EGD) WITH PROPOFOL N/A 11/21/2023   Procedure: ESOPHAGOGASTRODUODENOSCOPY (EGD) WITH PROPOFOL;  Surgeon: Franky Macho, MD;  Location: [facility];  Service: Endoscopy;  Laterality: N/A;   EYE SURGERY     SUBMUCOSAL TATTOO INJECTION  11/21/2023   Procedure: SUBMUCOSAL TATTOO INJECTION;  Surgeon: Franky Macho, MD;  Location: [facility];  Service: Endoscopy;;    Social History: Social History   Socioeconomic History   Marital status: Widowed    Spouse name: Not on file   Number of children: Not on file   Years of education: Not on file   Highest education level: 12th grade  Occupational History   Not on file  Tobacco Use   Smoking status: Never   Smokeless tobacco: Never  Vaping Use   Vaping status: Never Used  Substance and Sexual Activity   Alcohol use: Never   Drug use: Never   Sexual activity: Not Currently    Birth control/protection: None  Other Topics Concern   Not on file  Social History Narrative   Not on file   Social Drivers of Health   Financial Resource Strain: Low Risk  (11/08/2023)   Overall Financial Resource Strain (CARDIA)    Difficulty of Paying Living Expenses: Not hard at all  Food Insecurity: Unknown (10/27/2023)   Hunger Vital Sign    Worried About Running Out of Food in the Last Year: Not on file    Ran Out of Food in the Last Year: Never true  Transportation Needs: No Transportation Needs (10/27/2023)   PRAPARE - Administrator, Civil Service (Medical): No    Lack of Transportation (Non-Medical): No  Physical Activity: Insufficiently Active (10/27/2023)   Exercise Vital Sign    Days of Exercise per Week: 3 days    Minutes of Exercise per Session: 30 min  Stress: No Stress Concern Present (10/27/2023)   [employer] of Occupational Health - Occupational Stress Questionnaire    Feeling of Stress : Not at all  Social Connections: Unknown (10/27/2023)   Social Connection and Isolation Panel [NHANES]    Frequency of Communication with Friends and Family: Not on file    Frequency of Social Gatherings with Friends and Family: More  than three times a week    Attends Religious Services: More than 4 times per year    Active Member of [employer] or Organizations: Not on file    Attends Banker Meetings: Not on file    Marital Status: Widowed  Recent Concern: Social Connections - Moderately Isolated (10/27/2023)   Social Connection and Isolation Panel [NHANES]    Frequency of Communication with Friends and Family: More than three times a week    Frequency of Social Gatherings with Friends and Family: More than three times a week    Attends Religious Services: More than 4 times per year    Active Member of [employer] or Organizations: No    Attends Banker Meetings: Never    Marital Status: Widowed  Intimate Partner Violence: Not At Risk (11/08/2023)   Humiliation, Afraid, Rape, and Kick questionnaire    Fear of Current or Ex-Partner: No    Emotionally Abused: No    Physically Abused: No    Sexually Abused: No    Family History: No  family history on file.  Allergies:  has no known allergies.  Medications:  Current Outpatient Medications  Medication Sig Dispense Refill   aspirin EC 81 MG tablet Take 1 tablet by mouth daily.     famotidine (PEPCID) 20 MG tablet TAKE 1 TABLET BY MOUTH TWICE  DAILY 180 tablet 3   Fish Oil-Cholecalciferol (FISH OIL + D3) 1000-1000 MG-UNIT CAPS Take 2 capsules by mouth daily.     GAVILYTE-G 236 g solution Take 4,000 mLs by mouth once.     Iron, Ferrous Sulfate, 325 (65 Fe) MG TABS Take 325 mg by mouth daily. 30 tablet 3   metroNIDAZOLE (FLAGYL) 500 MG tablet Take 1,000 mg by mouth 3 (three) times daily.     Multiple Vitamin (MULTIVITAMIN) tablet Take 1 tablet by mouth daily.     neomycin (MYCIFRADIN) 500 MG tablet Take 1,000 mg by mouth 3 (three) times daily.     ondansetron (ZOFRAN) 4 MG tablet Take by mouth.     valsartan (DIOVAN) 160 MG tablet Take 1 tablet (160 mg total) by mouth daily. 90 tablet 3   No current facility-administered medications for this visit.     Review of Systems: Constitutional: Denies fevers, chills or abnormal night sweats Eyes: Denies blurriness of vision, double vision or watery eyes Ears, nose, mouth, throat, and face: Denies mucositis or sore throat Respiratory: Denies cough, dyspnea or wheezes Cardiovascular: Denies palpitation, chest discomfort or lower extremity swelling Gastrointestinal:  Denies nausea, heartburn or change in bowel habits Skin: Denies abnormal skin rashes Lymphatics: Denies new lymphadenopathy or easy bruising Neurological:Denies numbness, tingling or new weaknesses Behavioral/Psych: Mood is stable, no new changes  All other systems were reviewed with the patient and are negative.  Physical Examination: ECOG PERFORMANCE STATUS: 1 - Symptomatic but completely ambulatory  Vitals:   11/30/23 1015  BP: (!) 139/44  Pulse: 96  Resp: 19  Temp: 98.2 F (36.8 C)  SpO2: 100%   Filed Weights   11/30/23 1015  Weight: 158 lb (71.7 kg)    GENERAL:alert, no distress and comfortable SKIN: skin color, texture, turgor are normal, no rashes or significant lesions EYES: normal, conjunctiva are pink and non-injected, sclera clear OROPHARYNX:no exudate, no erythema and lips, buccal mucosa, and tongue normal  NECK: supple, thyroid normal size, non-tender, without nodularity LYMPH:  no palpable lymphadenopathy in the cervical, axillary or inguinal LUNGS: clear to auscultation and percussion with normal breathing effort HEART: regular rate & rhythm and no murmurs and no lower extremity edema ABDOMEN:abdomen soft, non-tender and normal bowel sounds Musculoskeletal:no cyanosis of digits and no clubbing  PSYCH: alert & oriented x 3 with fluent speech NEURO: no focal motor/sensory deficits  Pathology: pathology   Laboratory Data: I have reviewed the data as listed Lab Results  Component Value Date   WBC 9.7 11/08/2023   HGB 8.7 (L) 11/18/2023   HCT 28.5 (L) 11/18/2023   MCV 80 11/08/2023   PLT 409  11/08/2023   Recent Labs    10/31/23 0942  NA 140  K 5.1  CL 100  CO2 23  GLUCOSE 110*  BUN 18  CREATININE 0.84  CALCIUM 8.9  PROT 6.0  ALBUMIN 3.7  AST 17  ALT 10  ALKPHOS 100  BILITOT <0.2    Latest Reference Range & Units 11/08/23 08:41  Iron 27 - 139 ug/dL 21 (L)  UIBC 865 - 784 ug/dL 696  TIBC 295 - 284 ug/dL 132  Ferritin 15 - 440 ng/mL 25  Iron Saturation 15 -  55 % 7 (LL)  (LL): Data is critically low (L): Data is abnormally low  Radiographic Studies: I have personally reviewed the radiological images as listed and agreed with the findings in the report. CT CHEST ABDOMEN PELVIS W CONTRAST Result Date: 11/29/2023 CLINICAL DATA:  Colon cancer, staging.  * Tracking Code: BO * EXAM: CT CHEST, ABDOMEN, AND PELVIS WITH CONTRAST TECHNIQUE: Multidetector CT imaging of the chest, abdomen and pelvis was performed following the standard protocol during bolus administration of intravenous contrast. RADIATION DOSE REDUCTION: This exam was performed according to the departmental dose-optimization program which includes automated exposure control, adjustment of the mA and/or kV according to patient size and/or use of iterative reconstruction technique. CONTRAST:  OMNIPAQUE IOHEXOL 300 MG/ML  SOLN COMPARISON:  Multiple priors including CT November 11, 2023 and April 29, 2022 FINDINGS: CT CHEST FINDINGS Cardiovascular: Aortic atherosclerosis. Normal size heart. Calcifications of the mitral annulus. Coronary artery calcifications. Mediastinum/Nodes: No supraclavicular adenopathy. No suspicious thyroid nodule. No pathologically enlarged mediastinal, hilar or axillary lymph nodes. Small hiatal hernia. Lungs/Pleura: Biapical pleuroparenchymal scarring. 11 mm ground-glass pulmonary nodule in the right upper lobe on image 35/3 is unchanged from prior. No pulmonary nodule suspicious for metastatic disease. Musculoskeletal: No aggressive lytic or blastic lesion of bone. Multilevel vertebral body  hemangiomas. CT ABDOMEN PELVIS FINDINGS Hepatobiliary: No suspicious hepatic lesion. Gallbladder is unremarkable. No biliary ductal dilation. Pancreas: No pancreatic ductal dilation or evidence of acute inflammation. Spleen: No splenomegaly. Adrenals/Urinary Tract: Bilateral adrenal glands appear normal. No hydronephrosis. Kidneys demonstrate symmetric enhancement. Urinary bladder is unremarkable for degree of distension. Stomach/Bowel: Radiopaque enteric contrast material traverses distal loops of small bowel. Moderate-sized hiatal hernia. No evidence of bowel obstruction. Sigmoid colonic diverticulosis. Short segment irregular wall thickening of the ascending colon with the adjacent lobular fat stranding. Vascular/Lymphatic: Aortic atherosclerosis. Circumaortic left renal vein. The portal, splenic and superior mesenteric veins are patent. Enlarged ileocolic lymph nodes measure up to 11 mm in short axis on image 97/2. Prominent lymph nodes along the mesenteric root and retroperitoneum for instance: -a mesenteric root lymph node measures 6 mm in short axis on image 69/2. -aortocaval lymph node measures 6 mm on image 84/2. Reproductive: Uterus and bilateral adnexa are unremarkable. Other: No significant abdominopelvic free fluid. Musculoskeletal: No aggressive lytic or blastic lesion of bone. Sclerotic lesion in the right ischial tuberosity on image 126/2 is stable dating back to August 02, 2019 compatible with a benign finding. Patchy lucency in the left iliac wing on image 88/2 is stable dating back to CT August 02, 2019 compatible with a benign finding. IMPRESSION: 1. Short segment irregular wall thickening of the ascending colon with adjacent lobular fat stranding, compatible with known primary colonic neoplasm. 2. Enlarged ileocolic lymph nodes measure up to 11 mm in short axis, compatible for nodal disease involvement. 3. Prominent lymph nodes along the mesenteric root and retroperitoneum are nonspecific and  may be reactive or reflect nodal disease involvement. 4. No evidence of metastatic disease in the chest. 5. Stable 11 mm ground-glass pulmonary nodule in the right upper lobe, suggest continued attention on follow-up imaging. 6. Moderate-sized hiatal hernia. 7. Sigmoid colonic diverticulosis without evidence of acute diverticulitis. 8.  Aortic Atherosclerosis (ICD10-I70.0). Electronically Signed   By: Maudry Mayhew M.D.   On: 11/29/2023 16:30   CT ABDOMEN PELVIS WO CONTRAST Result Date: 11/14/2023 CLINICAL DATA:  Right lower quadrant pain EXAM: CT ABDOMEN AND PELVIS WITHOUT CONTRAST TECHNIQUE: Multidetector CT imaging of the abdomen and pelvis was performed following  the standard protocol without IV contrast. RADIATION DOSE REDUCTION: This exam was performed according to the departmental dose-optimization program which includes automated exposure control, adjustment of the mA and/or kV according to patient size and/or use of iterative reconstruction technique. COMPARISON:  08/02/2019 FINDINGS: Lower chest: Lung bases are free of acute infiltrate or sizable effusion. Tiny 2-3 mm nodule is noted adjacent to the cardiac border in the medial right lower lobe best seen on image number 6 of series 4. Given its size no further follow-up is recommended. Hepatobiliary: No focal liver abnormality is seen. No gallstones, gallbladder wall thickening, or biliary dilatation. Pancreas: Unremarkable. No pancreatic ductal dilatation or surrounding inflammatory changes. Spleen: Normal in size without focal abnormality. Adrenals/Urinary Tract: Adrenal glands are within normal limits. Kidneys are well visualized bilaterally. No renal calculi or obstructive changes are seen. The bladder is partially distended. Stomach/Bowel: Diverticular change of the colon is noted without evidence of diverticulitis. Appendix is within normal limits. In the ascending colon just superior to the appendix, there is enlarged masslike area occupying the  cecum and ascending colon consistent with colonic neoplasm till proven otherwise. This measures approximately 8.5 x 4.9 cm in greatest transverse and AP dimensions and extends for approximately 9.6 cm. Some surrounding lymph nodes are identified as well. The small bowel and stomach appear within normal limits. Vascular/Lymphatic: Aortic atherosclerotic changes are noted. Right lower quadrant mesenteric nodes are noted measuring up to 1 cm. No other significant adenopathy is seen. Reproductive: Uterus and bilateral adnexa are unremarkable. Other: No abdominal wall hernia or abnormality. No abdominopelvic ascites. Musculoskeletal: No acute or significant osseous findings. IMPRESSION: Large irregular mass lesion in the ascending colon as described consistent with colonic neoplasm till proven otherwise. Direct visualization is recommended. These results will be called to the ordering clinician or representative by the Radiologist Assistant, and communication documented in the PACS or [employer]. Electronically Signed   By: Alcide Clever M.D.   On: 11/14/2023 22:40    ASSESSMENT & PLAN:  Patient is an 89 year old female referred for newly diagnosed right-sided colonic adenocarcinoma.    Malignant neoplasm of ascending colon (HCC) Newly diagnosed, right sided colonic mass.  No evidence of metastasis on CT scan.  Surgery appointment scheduled from 21st March.Discussed potential need for chemotherapy post-surgery, depending on the pathological findings and stage. - Proceed with scheduled surgery. - Post-surgery, review pathological findings to determine stage and need for chemotherapy. -Will send MSI testing on path specimen  Plan to see the patient 2-4 weeks post-surgery to discuss surgical and pathological findings, and to determine the need for further treatment.  IDA (iron deficiency anemia) Iron deficiency anemia likely secondary to GI bleed.  Significantly low iron levels. -The most likely cause  of her anemia is due to chronic blood loss/malabsorption syndrome.We discussed some of the risks, benefits, and alternatives of intravenous iron infusions. The patient is symptomatic from anemia and the iron level is critically low. She desires to achieve higher levels of iron faster for adequate hematopoesis. Some of the side-effects to be expected including risks of infusion reactions, phlebitis, headaches, nausea and fatigue.  The patient is willing to proceed. Patient education material was dispensed.  Goal is to keep ferritin level greater than 50 and resolution of anemia -Resume oral iron supplements every other day postsurgery  Will recheck labs on return to clinic.   Orders Placed This Encounter  Procedures   Folate    Standing Status:   Future    Expected Date:  01/03/2024    Expiration Date:   11/29/2024   Ferritin    Standing Status:   Future    Expected Date:   01/03/2024    Expiration Date:   11/29/2024   Vitamin B12    Standing Status:   Future    Expected Date:   01/03/2024    Expiration Date:   11/29/2024   CBC with Differential/Platelet    Standing Status:   Future    Expected Date:   01/03/2024    Expiration Date:   11/29/2024   Comprehensive metabolic panel    Standing Status:   Future    Expected Date:   01/03/2024    Expiration Date:   11/29/2024   Iron and TIBC    Standing Status:   Future    Expected Date:   01/03/2024    Expiration Date:   11/29/2024    The total time spent in the appointment was 60 minutes encounter with patients including review of chart and various tests results, discussions about plan of care and coordination of care plan   All questions were answered. The patient knows to call the clinic with any problems, questions or concerns. No barriers to learning was detected.  Cindie Crumbly, MD 3/5/20252:53 PM

## 2023-11-30 NOTE — Assessment & Plan Note (Signed)
 Remains adequately controlled with valsartan 160 mg daily.  No medication changes are indicated today.

## 2023-12-01 ENCOUNTER — Other Ambulatory Visit: Payer: Self-pay

## 2023-12-01 ENCOUNTER — Encounter: Payer: Self-pay | Admitting: Oncology

## 2023-12-01 DIAGNOSIS — C182 Malignant neoplasm of ascending colon: Secondary | ICD-10-CM

## 2023-12-05 ENCOUNTER — Inpatient Hospital Stay

## 2023-12-05 VITALS — BP 111/44 | HR 63 | Temp 98.2°F | Resp 16

## 2023-12-05 DIAGNOSIS — D500 Iron deficiency anemia secondary to blood loss (chronic): Secondary | ICD-10-CM

## 2023-12-05 DIAGNOSIS — C182 Malignant neoplasm of ascending colon: Secondary | ICD-10-CM

## 2023-12-05 DIAGNOSIS — I10 Essential (primary) hypertension: Secondary | ICD-10-CM | POA: Diagnosis not present

## 2023-12-05 DIAGNOSIS — D509 Iron deficiency anemia, unspecified: Secondary | ICD-10-CM | POA: Diagnosis not present

## 2023-12-05 LAB — SURGICAL PATHOLOGY

## 2023-12-05 MED ORDER — SODIUM CHLORIDE 0.9 % IV SOLN
1000.0000 mg | Freq: Once | INTRAVENOUS | Status: AC
  Administered 2023-12-05: 1000 mg via INTRAVENOUS
  Filled 2023-12-05: qty 10

## 2023-12-05 MED ORDER — CETIRIZINE HCL 10 MG/ML IV SOLN
10.0000 mg | Freq: Once | INTRAVENOUS | Status: AC
  Administered 2023-12-05: 10 mg via INTRAVENOUS
  Filled 2023-12-05: qty 1

## 2023-12-05 MED ORDER — SODIUM CHLORIDE 0.9 % IV SOLN
INTRAVENOUS | Status: DC
Start: 2023-12-05 — End: 2023-12-05

## 2023-12-05 MED ORDER — ACETAMINOPHEN 325 MG PO TABS
650.0000 mg | ORAL_TABLET | Freq: Once | ORAL | Status: AC
  Administered 2023-12-05: 650 mg via ORAL
  Filled 2023-12-05: qty 2

## 2023-12-05 NOTE — Progress Notes (Signed)
Patient presents today for iron infusion.  Patient is in satisfactory condition with no new complaints voiced.  Vital signs are stable.  IV placed in L arm.  IV flushed well with good blood return noted.   We will proceed with infusion per provider orders.    Patient tolerated infusion well with no complaints voiced.  Patient left ambulatory in stable condition.  Vital signs stable at discharge.  Follow up as scheduled.    

## 2023-12-05 NOTE — Patient Instructions (Signed)
 [facility] [place] - A DEPT OF [facility]  Discharge Instructions: Thank you for choosing [facility] to provide your oncology and hematology care.  If you have a lab appointment with the Cancer Center - please note that after April 8th, 2024, all labs will be drawn in the cancer center.  You do not have to check in or register with the main entrance as you have in the past but will complete your check-in in the cancer center.  Wear comfortable clothing and clothing appropriate for easy access to any Portacath or PICC line.   We strive to give you quality time with your provider. You may need to reschedule your appointment if you arrive late (15 or more minutes).  Arriving late affects you and other patients whose appointments are after yours.  Also, if you miss three or more appointments without notifying the office, you may be dismissed from the clinic at the provider's discretion.      For prescription refill requests, have your pharmacy contact our office and allow 72 hours for refills to be completed.    Today you received the following:  Monoferric.  Ferric Derisomaltose Injection What is this medication? FERRIC DERISOMALTOSE (FER ik der EYE soe MAWL tose) treats low levels of iron in your body (iron deficiency anemia). Iron is a mineral that plays an important role in making red blood cells, which carry oxygen from your lungs to the rest of your body. This medicine may be used for other purposes; ask your health care provider or pharmacist if you have questions. COMMON BRAND NAME(S): MONOFERRIC What should I tell my care team before I take this medication? They need to know if you have any of these conditions: High levels of iron in the blood An unusual or allergic reaction to iron, other medications, foods, dyes, or preservatives Pregnant or trying to get pregnant Breastfeeding How should I use this medication? This medication is injected into  a vein. It is given by your care team in a hospital or clinic setting. Talk to your care team about the use of this medication in children. Special care may be needed. Overdosage: If you think you have taken too much of this medicine contact a poison control center or emergency room at once. NOTE: This medicine is only for you. Do not share this medicine with others. What if I miss a dose? It is important not to miss your dose. Call your care team if you are unable to keep an appointment. What may interact with this medication? Do not take this medication with any of the following: Deferoxamine Dimercaprol Other iron products This list may not describe all possible interactions. Give your health care provider a list of all the medicines, herbs, non-prescription drugs, or dietary supplements you use. Also tell them if you smoke, drink alcohol, or use illegal drugs. Some items may interact with your medicine. What should I watch for while using this medication? Visit your care team for regular checks on your progress. Tell your care team if your symptoms do not start to get better or if they get worse. You may need blood work done while you are taking this medication. You may need to eat more foods that contain iron. Talk to your care team. Foods that contain iron include whole grains or cereals, dried fruits, beans, peas, leafy green vegetables, and organ meats (liver, kidney). What side effects may I notice from receiving this medication? Side  effects that you should report to your care team as soon as possible: Allergic reactions--skin rash, itching, hives, swelling of the face, lips, tongue, or throat Low blood pressure--dizziness, feeling faint or lightheaded, blurry vision Shortness of breath Side effects that usually do not require medical attention (report to your care team if they continue or are bothersome): Flushing Headache Joint pain Muscle pain Nausea Pain, redness, or  irritation at injection site This list may not describe all possible side effects. Call your doctor for medical advice about side effects. You may report side effects to FDA at 1-800-FDA-1088. Where should I keep my medication? This medication is given in a hospital or clinic. It will not be stored at home. NOTE: This sheet is a summary. It may not cover all possible information. If you have questions about this medicine, talk to your doctor, pharmacist, or health care provider.  2024 Elsevier/Gold Standard (2023-05-04 00:00:00)       To help prevent nausea and vomiting after your treatment, we encourage you to take your nausea medication as directed.  BELOW ARE SYMPTOMS THAT SHOULD BE REPORTED IMMEDIATELY: *FEVER GREATER THAN 100.4 F (38 C) OR HIGHER *CHILLS OR SWEATING *NAUSEA AND VOMITING THAT IS NOT CONTROLLED WITH YOUR NAUSEA MEDICATION *UNUSUAL SHORTNESS OF BREATH *UNUSUAL BRUISING OR BLEEDING *URINARY PROBLEMS (pain or burning when urinating, or frequent urination) *BOWEL PROBLEMS (unusual diarrhea, constipation, pain near the anus) TENDERNESS IN MOUTH AND THROAT WITH OR WITHOUT PRESENCE OF ULCERS (sore throat, sores in mouth, or a toothache) UNUSUAL RASH, SWELLING OR PAIN  UNUSUAL VAGINAL DISCHARGE OR ITCHING   Items with * indicate a potential emergency and should be followed up as soon as possible or go to the Emergency Department if any problems should occur.  Please show the CHEMOTHERAPY ALERT CARD or IMMUNOTHERAPY ALERT CARD at check-in to the Emergency Department and triage nurse.  Should you have questions after your visit or need to cancel or reschedule your appointment, please contact [facility] [place] - A DEPT OF Eligh[facility] [phone]  and follow the prompts.  Office hours are 8:00 a.m. to 4:30 p.m. Monday - Friday. Please note that voicemails left after 4:00 p.m. may not be returned until the following business day.  We are closed weekends  and major holidays. You have access to a nurse at all times for urgent questions. Please call the main number to the clinic [phone] and follow the prompts.  For any non-urgent questions, you may also contact your provider using MyChart. We now offer e-Visits for anyone 70 and older to request care online for non-urgent symptoms. For details visit [email or web address].   Also download the MyChart app! Go to the app store, search "MyChart", open the app, select [facility], and log in with your MyChart username and password.

## 2023-12-06 ENCOUNTER — Telehealth: Payer: Self-pay | Admitting: Internal Medicine

## 2023-12-06 LAB — CEA: CEA: 9.3 ng/mL — ABNORMAL HIGH (ref 0.0–4.7)

## 2023-12-06 NOTE — Telephone Encounter (Signed)
 Patient came by office and asking if Dr Durwin Nora would reach out to her has few questions about upcoming surgery questions. Her phone # [phone]

## 2023-12-11 NOTE — Progress Notes (Signed)
 COVID Vaccine received:  []  No [x]  Yes Date of any COVID positive Test in last 90 days:  PCP - Christel Mormon, MD  Cardiologist -  Oncology-  Cindie Crumbly, MD at [facility].   Chest x-ray - 07-10-2022  2v  Epic EKG -  10-23-2021  Epic   will repeat Stress Test -  ECHO -  Cardiac Cath -   PCR screen: []  Ordered & Completed [x]   No Order but Needs PROFEND     []   N/A for this surgery  Surgery Plan:  []  Ambulatory   []  Outpatient in bed  [x]  Admit Anesthesia:    [x]  General  []  Spinal  []   Choice []   MAC  Bowel Prep - []  No  [x]   Yes ______  Pacemaker / ICD device [x]  No []  Yes   Spinal Cord Stimulator:[x]  No []  Yes       History of Sleep Apnea? [x]  No []  Yes   CPAP used?- [x]  No []  Yes    Does the patient monitor blood sugar?   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 Last infusion 12-05-23  Activity level: Patient is able / unable to climb a flight of stairs without difficulty; []  No CP  []  No SOB, but would have ___   Patient can / can not perform ADLs without assistance.   Anesthesia review: HTN, anemia, GERD, CKD3a, ?Heart murmur-systolic heard by Dr. Allena Katz per 06-08-23 Epic note. Not heard on other notes  Patient denies shortness of breath, fever, cough and chest pain at PAT appointment.  Patient verbalized understanding and agreement to the Pre-Surgical Instructions that were given to them at this PAT appointment. Patient was also educated of the need to review these PAT instructions again prior to her surgery.I reviewed the  appropriate phone numbers to call if they have any and questions or concerns.

## 2023-12-11 NOTE — Patient Instructions (Signed)
 SURGICAL WAITING ROOM VISITATION Patients having surgery or a procedure may have no more than 2 support people in the waiting area - these visitors may rotate in the visitor waiting room.   Due to an increase in RSV and influenza rates and associated hospitalizations, children ages 64 and under may not visit patients in [facility] hospitals. If the patient needs to stay at the hospital during part of their recovery, the visitor guidelines for inpatient rooms apply.  PRE-OP VISITATION  Pre-op nurse will coordinate an appropriate time for 1 support person to accompany the patient in pre-op.  This support person may not rotate.  This visitor will be contacted when the time is appropriate for the visitor to come back in the pre-op area.  Please refer to the [facility] website for the visitor guidelines for Inpatients (after your surgery is over and you are in a regular room).  You are not required to quarantine at this time prior to your surgery. However, you must do this: Hand Hygiene often Do NOT share personal items Notify your provider if you are in close contact with someone who has COVID or you develop fever 100.4 or greater, new onset of sneezing, cough, sore throat, shortness of breath or body aches.  If you test positive for Covid or have been in contact with anyone that has tested positive in the last 10 days please notify you surgeon.    Your procedure is scheduled on:  FRIDAY  December 16, 2023  Report to [facility] Main Entrance: [facility]sen entrance where the [employer] is available.   Report to admitting at: 05:15    AM  Call this number if you have any questions or problems the morning of surgery [phone]  FOLLOW ANY ADDITIONAL PRE OP INSTRUCTIONS YOU RECEIVED FROM YOUR SURGEON'S OFFICE!!!  Dulcolax 20 mg (total) - Take 4 (four) of the 5 mg Dulcolax tablets with water at 07:00 am the day prior to surgery.  Miralax 255 g - Mix with 64 oz Gatorade/Powerade.   Starting at 10:00 am ,Drink this gradually over the next few hours (8 oz glass every 15-30 minutes) until gone the day prior to surgery You should finish in 4 hours-6 hours.    Neomycin 1000 mg - At 2 pm, 3 pm and 10 pm after Miralax  bowel prep the day prior to surgery.  Metronidazole 1000 mg - At 2 pm, 3 pm and 10 pm after Miralax bowel prep the day prior to surgery.   Drink plenty of clear liquids all evening to avoid getting dehydrated.   DRINK two (2) bottles of Pre-Surgery Clear Ensure drink starting at 6:00 pm the evening prior to your surgery to help prevent dehydration. Increase drinking clear fluids (see list below)          Do not eat food after Midnight the night prior to your surgery/procedure.  After Midnight you may have the following liquids until   04:30 AM DAY OF SURGERY  Clear Liquid Diet Water Black Coffee (sugar ok, NO MILK/CREAM OR CREAMERS)  Tea (sugar ok, NO MILK/CREAM OR CREAMERS) regular and decaf                             Plain Jell-O  with no fruit (NO RED)  Fruit ices (not with fruit pulp, NO RED)                                     Popsicles (NO RED)                                                                  Juice: NO CITRUS JUICES: only apple, WHITE grape, WHITE cranberry Sports drinks like Gatorade or Powerade (NO RED)                   The day of surgery:  Drink ONE (1) Pre-Surgery Clear Ensure at   04:30 AM the morning of surgery. Drink in one sitting. Do not sip.  This drink was given to you during your hospital pre-op appointment visit. Nothing else to drink after completing the Pre-Surgery Clear Ensure : No candy, chewing gum or throat lozenges.     Oral Hygiene is also important to reduce your risk of infection.        Remember - BRUSH YOUR TEETH THE MORNING OF SURGERY WITH YOUR REGULAR TOOTHPASTE  Do NOT smoke after Midnight the night before surgery.  STOP TAKING all Vitamins, Herbs and  supplements 1 week before your surgery.   Take ONLY these medicines the morning of surgery with A SIP OF WATER: famotidine  You may not have any metal on your body including hair pins, jewelry, and body piercing  Do not wear make-up, lotions, powders, perfumes or deodorant  Do not wear nail polish including gel and S&S, artificial / acrylic nails, or any other type of covering on natural nails including finger and toenails. If you have artificial nails, gel coating, etc., that needs to be removed by a nail salon, Please have this removed prior to surgery. Not doing so may mean that your surgery could be cancelled or delayed if the Surgeon or anesthesia staff feels like they are unable to monitor you safely.   Do not shave 48 hours prior to surgery to avoid nicks in your skin which may contribute to postoperative infections.   Contacts, Hearing Aids, dentures or bridgework may not be worn into surgery. DENTURES WILL BE REMOVED PRIOR TO SURGERY PLEASE DO NOT APPLY "Poly grip" OR ADHESIVES!!!  You may bring a small overnight bag with you on the day of surgery, only pack items that are not valuable. [facility] IS NOT RESPONSIBLE   FOR VALUABLES THAT ARE LOST OR STOLEN.   Do not bring your home medications to the hospital. The Pharmacy will dispense medications listed on your medication list to you during your admission in the Hospital.  Special Instructions: Bring a copy of your healthcare power of attorney and living will documents the day of surgery, if you wish to have them scanned into your [facility] Records- EPIC  Please read over the following fact sheets you were given: IF YOU HAVE QUESTIONS ABOUT YOUR PRE-OP INSTRUCTIONS, PLEASE CALL [phone]   [facility] - Preparing for Surgery Before surgery, you can play an important role.  Because skin is not sterile, your skin needs to be as free of germs as possible.  You can reduce the number of germs on  your skin by washing with CHG  (chlorahexidine gluconate) soap before surgery.  CHG is an antiseptic cleaner which kills germs and bonds with the skin to continue killing germs even after washing. Please DO NOT use if you have an allergy to CHG or antibacterial soaps.  If your skin becomes reddened/irritated stop using the CHG and inform your nurse when you arrive at Short Stay. Do not shave (including legs and underarms) for at least 48 hours prior to the first CHG shower.  You may shave your face/neck.  Please follow these instructions carefully:  1.  Shower with CHG Soap the night before surgery and the  morning of surgery.  2.  If you choose to wash your hair, wash your hair first as usual with your normal  shampoo.  3.  After you shampoo, rinse your hair and body thoroughly to remove the shampoo.                             4.  Use CHG as you would any other liquid soap.  You can apply chg directly to the skin and wash.  Gently with a scrungie or clean washcloth.  5.  Apply the CHG Soap to your body ONLY FROM THE NECK DOWN.   Do not use on face/ open                           Wound or open sores. Avoid contact with eyes, ears mouth and genitals (private parts).                       Wash face,  Genitals (private parts) with your normal soap.             6.  Wash thoroughly, paying special attention to the area where your  surgery  will be performed.  7.  Thoroughly rinse your body with warm water from the neck down.  8.  DO NOT shower/wash with your normal soap after using and rinsing off the CHG Soap.            9.  Pat yourself dry with a clean towel.            10.  Wear clean pajamas.            11.  Place clean sheets on your bed the night of your first shower and do not  sleep with pets.  ON THE DAY OF SURGERY : Do not apply any lotions/deodorants the morning of surgery.  Please wear clean clothes to the hospital/surgery center.     FAILURE TO FOLLOW THESE INSTRUCTIONS MAY RESULT IN THE CANCELLATION OF YOUR  SURGERY  PATIENT SIGNATURE_________________________________  NURSE SIGNATURE__________________________________  ________________________________________________________________________      Kayla [name]    An incentive spirometer is a tool that can help keep your lungs clear and active. This tool measures how well you are filling your lungs with each breath. Taking long deep breaths may help reverse or decrease the chance of developing breathing (pulmonary) problems (especially infection) following: A long period of time when you are unable to move or be active. BEFORE THE PROCEDURE  If the spirometer includes an indicator to show your best effort, your nurse or respiratory therapist will set it to a desired goal. If possible, sit up straight or lean slightly forward. Try not to slouch. Hold the incentive spirometer  in an upright position. INSTRUCTIONS FOR USE  Sit on the edge of your bed if possible, or sit up as far as you can in bed or on a chair. Hold the incentive spirometer in an upright position. Breathe out normally. Place the mouthpiece in your mouth and seal your lips tightly around it. Breathe in slowly and as deeply as possible, raising the piston or the ball toward the top of the column. Hold your breath for 3-5 seconds or for as long as possible. Allow the piston or ball to fall to the bottom of the column. Remove the mouthpiece from your mouth and breathe out normally. Rest for a few seconds and repeat Steps 1 through 7 at least 10 times every 1-2 hours when you are awake. Take your time and take a few normal breaths between deep breaths. The spirometer may include an indicator to show your best effort. Use the indicator as a goal to work toward during each repetition. After each set of 10 deep breaths, practice coughing to be sure your lungs are clear. If you have an incision (the cut made at the time of surgery), support your incision when coughing by placing a  pillow or rolled up towels firmly against it. Once you are able to get out of bed, walk around indoors and cough well. You may stop using the incentive spirometer when instructed by your caregiver.  RISKS AND COMPLICATIONS Take your time so you do not get dizzy or light-headed. If you are in pain, you may need to take or ask for pain medication before doing incentive spirometry. It is harder to take a deep breath if you are having pain. AFTER USE Rest and breathe slowly and easily. It can be helpful to keep track of a log of your progress. Your caregiver can provide you with a simple table to help with this. If you are using the spirometer at home, follow these instructions: SEEK MEDICAL CARE IF:  You are having difficultly using the spirometer. You have trouble using the spirometer as often as instructed. Your pain medication is not giving enough relief while using the spirometer. You develop fever of 100.5 F (38.1 C) or higher.                                                                                                    SEEK IMMEDIATE MEDICAL CARE IF:  You cough up bloody sputum that had not been present before. You develop fever of 102 F (38.9 C) or greater. You develop worsening pain at or near the incision site. MAKE SURE YOU:  Understand these instructions. Will watch your condition. Will get help right away if you are not doing well or get worse. Document Released: 01/24/2007 Document Revised: 12/06/2011 Document Reviewed: 03/27/2007 [facility] Patient Information 2014 [place], [place].       WHAT IS A BLOOD TRANSFUSION? Blood Transfusion Information  A transfusion is the replacement of blood or some of its parts. Blood is made up of multiple cells which provide different functions. Red blood cells carry oxygen and are used for blood  loss replacement. White blood cells fight against infection. Platelets control bleeding. Plasma helps clot blood. Other blood products  are available for specialized needs, such as hemophilia or other clotting disorders. BEFORE THE TRANSFUSION  Who gives blood for transfusions?  Healthy volunteers who are fully evaluated to make sure their blood is safe. This is blood bank blood. Transfusion therapy is the safest it has ever been in the practice of medicine. Before blood is taken from a donor, a complete history is taken to make sure that person has no history of diseases nor engages in risky social behavior (examples are intravenous drug use or sexual activity with multiple partners). The donor's travel history is screened to minimize risk of transmitting infections, such as malaria. The donated blood is tested for signs of infectious diseases, such as HIV and hepatitis. The blood is then tested to be sure it is compatible with you in order to minimize the chance of a transfusion reaction. If you or a relative donates blood, this is often done in anticipation of surgery and is not appropriate for emergency situations. It takes many days to process the donated blood. RISKS AND COMPLICATIONS Although transfusion therapy is very safe and saves many lives, the main dangers of transfusion include:  Getting an infectious disease. Developing a transfusion reaction. This is an allergic reaction to something in the blood you were given. Every precaution is taken to prevent this. The decision to have a blood transfusion has been considered carefully by your caregiver before blood is given. Blood is not given unless the benefits outweigh the risks. AFTER THE TRANSFUSION Right after receiving a blood transfusion, you will usually feel much better and more energetic. This is especially true if your red blood cells have gotten low (anemic). The transfusion raises the level of the red blood cells which carry oxygen, and this usually causes an energy increase. The nurse administering the transfusion will monitor you carefully for complications. HOME  CARE INSTRUCTIONS  No special instructions are needed after a transfusion. You may find your energy is better. Speak with your caregiver about any limitations on activity for underlying diseases you may have. SEEK MEDICAL CARE IF:  Your condition is not improving after your transfusion. You develop redness or irritation at the intravenous (IV) site. SEEK IMMEDIATE MEDICAL CARE IF:  Any of the following symptoms occur over the next 12 hours: Shaking chills. You have a temperature by mouth above 102 F (38.9 C), not controlled by medicine. Chest, back, or muscle pain. People around you feel you are not acting correctly or are confused. Shortness of breath or difficulty breathing. Dizziness and fainting. You get a rash or develop hives. You have a decrease in urine output. Your urine turns a dark color or changes to pink, red, or brown. Any of the following symptoms occur over the next 10 days: You have a temperature by mouth above 102 F (38.9 C), not controlled by medicine. Shortness of breath. Weakness after normal activity. The white part of the eye turns yellow (jaundice). You have a decrease in the amount of urine or are urinating less often. Your urine turns a dark color or changes to pink, red, or brown. Document Released: 09/10/2000 Document Revised: 12/06/2011 Document Reviewed: 04/29/2008 [facility] Patient Information 2014 [place], [place].         _______________________________________________________________________

## 2023-12-13 ENCOUNTER — Encounter (HOSPITAL_COMMUNITY)
Admission: RE | Admit: 2023-12-13 | Discharge: 2023-12-13 | Disposition: A | Discharge status: Home / Self Care | Source: Ambulatory Visit | Attending: General Surgery

## 2023-12-13 ENCOUNTER — Encounter (HOSPITAL_COMMUNITY): Payer: Self-pay

## 2023-12-13 ENCOUNTER — Other Ambulatory Visit: Payer: Self-pay

## 2023-12-13 VITALS — BP 138/50 | HR 92 | Temp 97.8°F | Resp 16 | Ht 69.0 in | Wt 155.0 lb

## 2023-12-13 DIAGNOSIS — N1831 Chronic kidney disease, stage 3a: Secondary | ICD-10-CM | POA: Diagnosis not present

## 2023-12-13 DIAGNOSIS — K219 Gastro-esophageal reflux disease without esophagitis: Secondary | ICD-10-CM | POA: Diagnosis not present

## 2023-12-13 DIAGNOSIS — I10 Essential (primary) hypertension: Secondary | ICD-10-CM

## 2023-12-13 DIAGNOSIS — E785 Hyperlipidemia, unspecified: Secondary | ICD-10-CM | POA: Diagnosis not present

## 2023-12-13 DIAGNOSIS — C189 Malignant neoplasm of colon, unspecified: Secondary | ICD-10-CM | POA: Diagnosis not present

## 2023-12-13 DIAGNOSIS — D500 Iron deficiency anemia secondary to blood loss (chronic): Secondary | ICD-10-CM

## 2023-12-13 DIAGNOSIS — I129 Hypertensive chronic kidney disease with stage 1 through stage 4 chronic kidney disease, or unspecified chronic kidney disease: Secondary | ICD-10-CM | POA: Diagnosis not present

## 2023-12-13 DIAGNOSIS — Z79899 Other long term (current) drug therapy: Secondary | ICD-10-CM | POA: Diagnosis not present

## 2023-12-13 DIAGNOSIS — K5669 Other partial intestinal obstruction: Secondary | ICD-10-CM | POA: Diagnosis not present

## 2023-12-13 DIAGNOSIS — C182 Malignant neoplasm of ascending colon: Secondary | ICD-10-CM | POA: Diagnosis not present

## 2023-12-13 DIAGNOSIS — K6389 Other specified diseases of intestine: Secondary | ICD-10-CM

## 2023-12-13 DIAGNOSIS — Z01818 Encounter for other preprocedural examination: Secondary | ICD-10-CM

## 2023-12-13 DIAGNOSIS — D509 Iron deficiency anemia, unspecified: Secondary | ICD-10-CM | POA: Diagnosis not present

## 2023-12-13 HISTORY — DX: Malignant (primary) neoplasm, unspecified: C80.1

## 2023-12-13 HISTORY — DX: Unspecified osteoarthritis, unspecified site: M19.90

## 2023-12-13 HISTORY — DX: Anemia, unspecified: D64.9

## 2023-12-13 LAB — CBC
HCT: 28.8 % — ABNORMAL LOW (ref 36.0–46.0)
Hemoglobin: 8.4 g/dL — ABNORMAL LOW (ref 12.0–15.0)
MCH: 24.9 pg — ABNORMAL LOW (ref 26.0–34.0)
MCHC: 29.2 g/dL — ABNORMAL LOW (ref 30.0–36.0)
MCV: 85.2 fL (ref 80.0–100.0)
Platelets: 408 10*3/uL — ABNORMAL HIGH (ref 150–400)
RBC: 3.38 MIL/uL — ABNORMAL LOW (ref 3.87–5.11)
RDW: 16.4 % — ABNORMAL HIGH (ref 11.5–15.5)
WBC: 14.9 10*3/uL — ABNORMAL HIGH (ref 4.0–10.5)
nRBC: 0 % (ref 0.0–0.2)

## 2023-12-13 LAB — BASIC METABOLIC PANEL
Anion gap: 11 (ref 5–15)
BUN: 17 mg/dL (ref 8–23)
CO2: 26 mmol/L (ref 22–32)
Calcium: 8.8 mg/dL — ABNORMAL LOW (ref 8.9–10.3)
Chloride: 101 mmol/L (ref 98–111)
Creatinine, Ser: 0.84 mg/dL (ref 0.44–1.00)
GFR, Estimated: >60 mL/min (ref >60)
Glucose, Bld: 119 mg/dL — ABNORMAL HIGH (ref 70–99)
Potassium: 4.3 mmol/L (ref 3.5–5.1)
Sodium: 138 mmol/L (ref 135–145)

## 2023-12-16 ENCOUNTER — Inpatient Hospital Stay (HOSPITAL_COMMUNITY): Payer: Self-pay | Admitting: Physician Assistant

## 2023-12-16 ENCOUNTER — Other Ambulatory Visit: Payer: Self-pay

## 2023-12-16 ENCOUNTER — Inpatient Hospital Stay (HOSPITAL_COMMUNITY): Payer: Self-pay | Admitting: Anesthesiology

## 2023-12-16 ENCOUNTER — Inpatient Hospital Stay (HOSPITAL_COMMUNITY)
Admission: RE | Admit: 2023-12-16 | Discharge: 2023-12-19 | DRG: 330 | Disposition: A | Discharge status: Home / Self Care | Attending: General Surgery | Admitting: General Surgery

## 2023-12-16 ENCOUNTER — Encounter (HOSPITAL_COMMUNITY): Payer: Self-pay | Admitting: General Surgery

## 2023-12-16 ENCOUNTER — Encounter (HOSPITAL_COMMUNITY)
Admission: RE | Disposition: A | Discharge status: Home / Self Care | Payer: Self-pay | Source: Home / Self Care | Attending: General Surgery

## 2023-12-16 DIAGNOSIS — Z79899 Other long term (current) drug therapy: Secondary | ICD-10-CM | POA: Diagnosis not present

## 2023-12-16 DIAGNOSIS — C182 Malignant neoplasm of ascending colon: Principal | ICD-10-CM | POA: Diagnosis present

## 2023-12-16 DIAGNOSIS — C189 Malignant neoplasm of colon, unspecified: Secondary | ICD-10-CM | POA: Diagnosis not present

## 2023-12-16 DIAGNOSIS — I129 Hypertensive chronic kidney disease with stage 1 through stage 4 chronic kidney disease, or unspecified chronic kidney disease: Secondary | ICD-10-CM

## 2023-12-16 DIAGNOSIS — D509 Iron deficiency anemia, unspecified: Secondary | ICD-10-CM | POA: Diagnosis present

## 2023-12-16 DIAGNOSIS — K219 Gastro-esophageal reflux disease without esophagitis: Secondary | ICD-10-CM | POA: Diagnosis present

## 2023-12-16 DIAGNOSIS — K5669 Other partial intestinal obstruction: Secondary | ICD-10-CM | POA: Diagnosis present

## 2023-12-16 DIAGNOSIS — E785 Hyperlipidemia, unspecified: Secondary | ICD-10-CM

## 2023-12-16 DIAGNOSIS — N1831 Chronic kidney disease, stage 3a: Secondary | ICD-10-CM

## 2023-12-16 DIAGNOSIS — I10 Essential (primary) hypertension: Secondary | ICD-10-CM | POA: Diagnosis present

## 2023-12-16 HISTORY — DX: Unspecified cataract: H26.9

## 2023-12-16 HISTORY — DX: Unspecified malignant neoplasm of skin, unspecified: C44.90

## 2023-12-16 HISTORY — DX: Allergy, unspecified, initial encounter: T78.40XA

## 2023-12-16 LAB — TYPE AND SCREEN
ABO/RH(D): A POS
Antibody Screen: NEGATIVE

## 2023-12-16 LAB — ABO/RH: ABO/RH(D): A POS

## 2023-12-16 SURGERY — COLECTOMY, PARTIAL, ROBOT-ASSISTED, LAPAROSCOPIC
Anesthesia: General

## 2023-12-16 MED ORDER — ONDANSETRON HCL 4 MG/2ML IJ SOLN
INTRAMUSCULAR | Status: AC
  Filled 2023-12-16: qty 2

## 2023-12-16 MED ORDER — ROCURONIUM BROMIDE 10 MG/ML (PF) SYRINGE
PREFILLED_SYRINGE | INTRAVENOUS | Status: DC | PRN
  Administered 2023-12-16: 30 mg via INTRAVENOUS
  Administered 2023-12-16: 10 mg via INTRAVENOUS
  Administered 2023-12-16: 30 mg via INTRAVENOUS

## 2023-12-16 MED ORDER — BSS PLUS IO SOLN
Freq: Once | INTRAOCULAR | Status: AC
  Administered 2023-12-16: 1 via INTRAOCULAR
  Filled 2023-12-16: qty 500

## 2023-12-16 MED ORDER — SUGAMMADEX SODIUM 200 MG/2ML IV SOLN
INTRAVENOUS | Status: DC | PRN
  Administered 2023-12-16: 200 mg via INTRAVENOUS

## 2023-12-16 MED ORDER — ACETAMINOPHEN 500 MG PO TABS
1000.0000 mg | ORAL_TABLET | Freq: Four times a day (QID) | ORAL | Status: DC
  Administered 2023-12-16 – 2023-12-19 (×10): 1000 mg via ORAL
  Filled 2023-12-16 (×11): qty 2

## 2023-12-16 MED ORDER — SPY AGENT GREEN - (INDOCYANINE FOR INJECTION)
INTRAMUSCULAR | Status: DC | PRN
  Administered 2023-12-16 (×2): 1 mL via INTRAVENOUS

## 2023-12-16 MED ORDER — POLYETHYLENE GLYCOL 3350 17 GM/SCOOP PO POWD: 1.0000 | Freq: Once | ORAL | Status: DC

## 2023-12-16 MED ORDER — FENTANYL CITRATE (PF) 100 MCG/2ML IJ SOLN
INTRAMUSCULAR | Status: AC
  Filled 2023-12-16: qty 2

## 2023-12-16 MED ORDER — FENTANYL CITRATE (PF) 100 MCG/2ML IJ SOLN
INTRAMUSCULAR | Status: DC | PRN
  Administered 2023-12-16 (×3): 25 ug via INTRAVENOUS
  Administered 2023-12-16: 50 ug via INTRAVENOUS
  Administered 2023-12-16 (×3): 25 ug via INTRAVENOUS

## 2023-12-16 MED ORDER — SODIUM CHLORIDE 0.9 % IV SOLN: INTRAVENOUS | Status: DC | PRN

## 2023-12-16 MED ORDER — SIMETHICONE 80 MG PO CHEW: 40.0000 mg | CHEWABLE_TABLET | Freq: Four times a day (QID) | ORAL | Status: DC | PRN

## 2023-12-16 MED ORDER — METHOCARBAMOL 500 MG PO TABS
1000.0000 mg | ORAL_TABLET | Freq: Three times a day (TID) | ORAL | Status: DC | PRN
Start: 2023-12-16 — End: 2023-12-19
  Administered 2023-12-16 – 2023-12-17 (×3): 1000 mg via ORAL
  Filled 2023-12-16 (×3): qty 2

## 2023-12-16 MED ORDER — LIDOCAINE HCL (PF) 2 % IJ SOLN
INTRAMUSCULAR | Status: AC
  Filled 2023-12-16: qty 5

## 2023-12-16 MED ORDER — SODIUM CHLORIDE 0.9 % IV SOLN
2.0000 g | INTRAVENOUS | Status: AC
  Administered 2023-12-16: 2 g via INTRAVENOUS
  Filled 2023-12-16: qty 2

## 2023-12-16 MED ORDER — ENSURE PRE-SURGERY PO LIQD
592.0000 mL | Freq: Once | ORAL | Status: DC
  Filled 2023-12-16: qty 592

## 2023-12-16 MED ORDER — HYDROMORPHONE HCL 1 MG/ML IJ SOLN
0.5000 mg | INTRAMUSCULAR | Status: DC | PRN
  Administered 2023-12-16 – 2023-12-18 (×4): 0.5 mg via INTRAVENOUS
  Filled 2023-12-16 (×5): qty 0.5

## 2023-12-16 MED ORDER — FAMOTIDINE 20 MG PO TABS
20.0000 mg | ORAL_TABLET | Freq: Every day | ORAL | Status: DC
  Administered 2023-12-17 – 2023-12-19 (×3): 20 mg via ORAL
  Filled 2023-12-16 (×3): qty 1

## 2023-12-16 MED ORDER — HEPARIN SODIUM (PORCINE) 5000 UNIT/ML IJ SOLN
5000.0000 [IU] | Freq: Once | INTRAMUSCULAR | Status: AC
  Administered 2023-12-16: 5000 [IU] via SUBCUTANEOUS
  Filled 2023-12-16: qty 1

## 2023-12-16 MED ORDER — DIPHENHYDRAMINE HCL 50 MG/ML IJ SOLN: 12.5000 mg | Freq: Four times a day (QID) | INTRAMUSCULAR | Status: DC | PRN

## 2023-12-16 MED ORDER — ALVIMOPAN 12 MG PO CAPS
12.0000 mg | ORAL_CAPSULE | Freq: Two times a day (BID) | ORAL | Status: DC
  Administered 2023-12-17: 12 mg via ORAL
  Filled 2023-12-16 (×2): qty 1

## 2023-12-16 MED ORDER — ALVIMOPAN 12 MG PO CAPS
12.0000 mg | ORAL_CAPSULE | ORAL | Status: AC
Start: 2023-12-16 — End: 2023-12-16
  Administered 2023-12-16: 12 mg via ORAL
  Filled 2023-12-16: qty 1

## 2023-12-16 MED ORDER — ORAL CARE MOUTH RINSE: 15.0000 mL | Freq: Once | OROMUCOSAL | Status: AC

## 2023-12-16 MED ORDER — BUPIVACAINE LIPOSOME 1.3 % IJ SUSP: 20.0000 mL | Freq: Once | INTRAMUSCULAR | Status: DC

## 2023-12-16 MED ORDER — PROPOFOL 10 MG/ML IV BOLUS
INTRAVENOUS | Status: DC | PRN
  Administered 2023-12-16: 100 mg via INTRAVENOUS
  Administered 2023-12-16: 50 mg via INTRAVENOUS

## 2023-12-16 MED ORDER — ENSURE PRE-SURGERY PO LIQD
296.0000 mL | Freq: Once | ORAL | Status: DC
  Filled 2023-12-16: qty 296

## 2023-12-16 MED ORDER — ACETAMINOPHEN 500 MG PO TABS
1000.0000 mg | ORAL_TABLET | ORAL | Status: AC
  Administered 2023-12-16: 1000 mg via ORAL
  Filled 2023-12-16: qty 2

## 2023-12-16 MED ORDER — ALUM & MAG HYDROXIDE-SIMETH 200-200-20 MG/5ML PO SUSP: 30.0000 mL | Freq: Four times a day (QID) | ORAL | Status: DC | PRN

## 2023-12-16 MED ORDER — ENOXAPARIN SODIUM 40 MG/0.4ML IJ SOSY
40.0000 mg | PREFILLED_SYRINGE | INTRAMUSCULAR | Status: DC
  Administered 2023-12-17 – 2023-12-19 (×3): 40 mg via SUBCUTANEOUS
  Filled 2023-12-16 (×3): qty 0.4

## 2023-12-16 MED ORDER — IRBESARTAN 150 MG PO TABS
150.0000 mg | ORAL_TABLET | Freq: Every day | ORAL | Status: DC
  Administered 2023-12-17 – 2023-12-19 (×3): 150 mg via ORAL
  Filled 2023-12-16 (×3): qty 1

## 2023-12-16 MED ORDER — FENTANYL CITRATE (PF) 100 MCG/2ML IJ SOLN
INTRAMUSCULAR | Status: AC
Start: 2023-12-16 — End: ?
  Filled 2023-12-16: qty 2

## 2023-12-16 MED ORDER — BUPIVACAINE LIPOSOME 1.3 % IJ SUSP
INTRAMUSCULAR | Status: DC | PRN
  Administered 2023-12-16: 20 mL

## 2023-12-16 MED ORDER — BISACODYL 5 MG PO TBEC
20.0000 mg | DELAYED_RELEASE_TABLET | Freq: Once | ORAL | Status: DC
Start: 2023-12-16 — End: 2023-12-16

## 2023-12-16 MED ORDER — KETOROLAC TROMETHAMINE 0.5 % OP SOLN
1.0000 [drp] | Freq: Four times a day (QID) | OPHTHALMIC | Status: DC | PRN
Start: 2023-12-16 — End: 2023-12-16
  Administered 2023-12-16: 1 [drp] via OPHTHALMIC
  Filled 2023-12-16: qty 5

## 2023-12-16 MED ORDER — PHENYLEPHRINE HCL-NACL 20-0.9 MG/250ML-% IV SOLN
INTRAVENOUS | Status: DC | PRN
  Administered 2023-12-16: 40 ug/min via INTRAVENOUS

## 2023-12-16 MED ORDER — GABAPENTIN 300 MG PO CAPS
300.0000 mg | ORAL_CAPSULE | ORAL | Status: AC
  Administered 2023-12-16: 300 mg via ORAL
  Filled 2023-12-16: qty 1

## 2023-12-16 MED ORDER — KCL IN DEXTROSE-NACL 20-5-0.45 MEQ/L-%-% IV SOLN
INTRAVENOUS | Status: DC
  Filled 2023-12-16 (×2): qty 1000

## 2023-12-16 MED ORDER — CHLORHEXIDINE GLUCONATE 0.12 % MT SOLN
15.0000 mL | Freq: Once | OROMUCOSAL | Status: AC
  Administered 2023-12-16: 15 mL via OROMUCOSAL

## 2023-12-16 MED ORDER — LACTATED RINGERS IV SOLN: INTRAVENOUS | Status: DC

## 2023-12-16 MED ORDER — ENSURE SURGERY PO LIQD
237.0000 mL | Freq: Two times a day (BID) | ORAL | Status: DC
  Administered 2023-12-17: 237 mL via ORAL

## 2023-12-16 MED ORDER — DEXAMETHASONE SODIUM PHOSPHATE 4 MG/ML IJ SOLN
INTRAMUSCULAR | Status: DC | PRN
  Administered 2023-12-16: 10 mg via INTRAVENOUS

## 2023-12-16 MED ORDER — ONDANSETRON HCL 4 MG PO TABS: 4.0000 mg | ORAL_TABLET | Freq: Four times a day (QID) | ORAL | Status: DC | PRN

## 2023-12-16 MED ORDER — BUPIVACAINE LIPOSOME 1.3 % IJ SUSP
INTRAMUSCULAR | Status: AC
  Filled 2023-12-16: qty 20

## 2023-12-16 MED ORDER — LIDOCAINE 2% (20 MG/ML) 5 ML SYRINGE
INTRAMUSCULAR | Status: DC | PRN
  Administered 2023-12-16: 1 mg/kg/h via INTRAVENOUS

## 2023-12-16 MED ORDER — DEXAMETHASONE SODIUM PHOSPHATE 10 MG/ML IJ SOLN
INTRAMUSCULAR | Status: AC
  Filled 2023-12-16: qty 1

## 2023-12-16 MED ORDER — ROCURONIUM BROMIDE 10 MG/ML (PF) SYRINGE
PREFILLED_SYRINGE | INTRAVENOUS | Status: AC
  Filled 2023-12-16: qty 10

## 2023-12-16 MED ORDER — ONDANSETRON HCL 4 MG/2ML IJ SOLN
INTRAMUSCULAR | Status: DC | PRN
  Administered 2023-12-16: 4 mg via INTRAVENOUS

## 2023-12-16 MED ORDER — LIDOCAINE HCL 2 % IJ SOLN
INTRAMUSCULAR | Status: AC
  Filled 2023-12-16: qty 20

## 2023-12-16 MED ORDER — SODIUM CHLORIDE 0.9 % IV SOLN
2.0000 g | Freq: Two times a day (BID) | INTRAVENOUS | Status: AC
  Administered 2023-12-16: 2 g via INTRAVENOUS
  Filled 2023-12-16: qty 2

## 2023-12-16 MED ORDER — AMISULPRIDE (ANTIEMETIC) 5 MG/2ML IV SOLN: 10.0000 mg | Freq: Once | INTRAVENOUS | Status: DC | PRN

## 2023-12-16 MED ORDER — ONDANSETRON HCL 4 MG/2ML IJ SOLN: 4.0000 mg | Freq: Once | INTRAMUSCULAR | Status: DC | PRN

## 2023-12-16 MED ORDER — GABAPENTIN 100 MG PO CAPS
300.0000 mg | ORAL_CAPSULE | Freq: Two times a day (BID) | ORAL | Status: DC
  Administered 2023-12-16 – 2023-12-19 (×6): 300 mg via ORAL
  Filled 2023-12-16 (×6): qty 3

## 2023-12-16 MED ORDER — 0.9 % SODIUM CHLORIDE (POUR BTL) OPTIME
TOPICAL | Status: DC | PRN
  Administered 2023-12-16: 2000 mL

## 2023-12-16 MED ORDER — FENTANYL CITRATE PF 50 MCG/ML IJ SOSY: 25.0000 ug | PREFILLED_SYRINGE | INTRAMUSCULAR | Status: DC | PRN

## 2023-12-16 MED ORDER — LIDOCAINE HCL (CARDIAC) PF 100 MG/5ML IV SOSY
PREFILLED_SYRINGE | INTRAVENOUS | Status: DC | PRN
  Administered 2023-12-16: 60 mg via INTRAVENOUS

## 2023-12-16 MED ORDER — ONDANSETRON HCL 4 MG/2ML IJ SOLN
4.0000 mg | Freq: Four times a day (QID) | INTRAMUSCULAR | Status: DC | PRN
  Administered 2023-12-17 – 2023-12-18 (×2): 4 mg via INTRAVENOUS
  Filled 2023-12-16 (×2): qty 2

## 2023-12-16 MED ORDER — BUPIVACAINE-EPINEPHRINE (PF) 0.25% -1:200000 IJ SOLN
INTRAMUSCULAR | Status: AC
  Filled 2023-12-16: qty 30

## 2023-12-16 MED ORDER — RINGERS IRRIGATION IR SOLN
Status: DC | PRN
  Administered 2023-12-16: 1

## 2023-12-16 MED ORDER — BUPIVACAINE-EPINEPHRINE 0.25% -1:200000 IJ SOLN
INTRAMUSCULAR | Status: DC | PRN
  Administered 2023-12-16: 30 mL

## 2023-12-16 MED ORDER — BSS IO SOLN
15.0000 mL | Freq: Once | INTRAOCULAR | Status: AC
  Administered 2023-12-16: 15 mL
  Filled 2023-12-16: qty 15

## 2023-12-16 MED ORDER — KETOROLAC TROMETHAMINE 0.5 % OP SOLN
1.0000 [drp] | Freq: Three times a day (TID) | OPHTHALMIC | Status: AC | PRN
  Administered 2023-12-16: 1 [drp] via OPHTHALMIC
  Filled 2023-12-16: qty 5

## 2023-12-16 MED ORDER — PROPOFOL 10 MG/ML IV BOLUS
INTRAVENOUS | Status: AC
  Filled 2023-12-16: qty 20

## 2023-12-16 MED ORDER — SACCHAROMYCES BOULARDII 250 MG PO CAPS
250.0000 mg | ORAL_CAPSULE | Freq: Two times a day (BID) | ORAL | Status: DC
  Administered 2023-12-16 – 2023-12-19 (×6): 250 mg via ORAL
  Filled 2023-12-16 (×6): qty 1

## 2023-12-16 MED ORDER — DIPHENHYDRAMINE HCL 12.5 MG/5ML PO ELIX: 12.5000 mg | ORAL_SOLUTION | Freq: Four times a day (QID) | ORAL | Status: DC | PRN

## 2023-12-16 SURGICAL SUPPLY — 75 items
BAG COUNTER SPONGE SURGICOUNT (BAG) ×1 IMPLANT
BLADE EXTENDED COATED 6.5IN (ELECTRODE) IMPLANT
CANNULA REDUCER 12-8 DVNC XI (CANNULA) IMPLANT
COVER SURGICAL LIGHT HANDLE (MISCELLANEOUS) ×2 IMPLANT
COVER TIP SHEARS 8 DVNC (MISCELLANEOUS) ×1 IMPLANT
DEFOGGER SCOPE WARMER CLEARIFY (MISCELLANEOUS) IMPLANT
DERMABOND ADVANCED .7 DNX6 (GAUZE/BANDAGES/DRESSINGS) IMPLANT
DRAIN CHANNEL 19F RND (DRAIN) IMPLANT
DRAPE ARM DVNC X/XI (DISPOSABLE) ×4 IMPLANT
DRAPE COLUMN DVNC XI (DISPOSABLE) ×1 IMPLANT
DRAPE SURG IRRIG POUCH 19X23 (DRAPES) ×1 IMPLANT
DRIVER NDL LRG 8 DVNC XI (INSTRUMENTS) ×1 IMPLANT
DRIVER NDLE LRG 8 DVNC XI (INSTRUMENTS) ×1 IMPLANT
DRSG OPSITE POSTOP 4X6 (GAUZE/BANDAGES/DRESSINGS) IMPLANT
DRSG OPSITE POSTOP 4X8 (GAUZE/BANDAGES/DRESSINGS) IMPLANT
ELECT PENCIL ROCKER SW 15FT (MISCELLANEOUS) ×1 IMPLANT
ELECT REM PT RETURN 15FT ADLT (MISCELLANEOUS) ×1 IMPLANT
EVACUATOR SILICONE 100CC (DRAIN) IMPLANT
GLOVE BIO SURGEON STRL SZ 6.5 (GLOVE) ×3 IMPLANT
GLOVE INDICATOR 6.5 STRL GRN (GLOVE) ×3 IMPLANT
GOWN SRG XL LVL 4 BRTHBL STRL (GOWNS) ×1 IMPLANT
GOWN STRL REUS W/ TWL XL LVL3 (GOWN DISPOSABLE) ×3 IMPLANT
GRASPER SUT TROCAR 14GX15 (MISCELLANEOUS) IMPLANT
GRASPER TIP-UP FEN DVNC XI (INSTRUMENTS) ×1 IMPLANT
HOLDER FOLEY CATH W/STRAP (MISCELLANEOUS) ×1 IMPLANT
IRRIG SUCT STRYKERFLOW 2 WTIP (MISCELLANEOUS) ×1 IMPLANT
IRRIGATION SUCT STRKRFLW 2 WTP (MISCELLANEOUS) ×1 IMPLANT
KIT PROCEDURE DVNC SI (MISCELLANEOUS) IMPLANT
KIT TURNOVER KIT A (KITS) IMPLANT
NDL INSUFFLATION 14GA 120MM (NEEDLE) ×1 IMPLANT
NEEDLE INSUFFLATION 14GA 120MM (NEEDLE) ×1 IMPLANT
PACK CARDIOVASCULAR III (CUSTOM PROCEDURE TRAY) ×1 IMPLANT
PACK COLON (CUSTOM PROCEDURE TRAY) ×1 IMPLANT
PAD POSITIONING PINK XL (MISCELLANEOUS) ×1 IMPLANT
RELOAD STAPLE 60 2.5 WHT DVNC (STAPLE) IMPLANT
RELOAD STAPLE 60 3.5 BLU DVNC (STAPLE) IMPLANT
RELOAD STAPLE 60 4.3 GRN DVNC (STAPLE) IMPLANT
RETRACTOR WND ALEXIS 18 MED (MISCELLANEOUS) IMPLANT
RTRCTR WOUND ALEXIS 18CM MED (MISCELLANEOUS) IMPLANT
SCISSORS LAP 5X35 DISP (ENDOMECHANICALS) IMPLANT
SCISSORS MNPLR CVD DVNC XI (INSTRUMENTS) ×1 IMPLANT
SEAL UNIV 5-12 XI (MISCELLANEOUS) ×3 IMPLANT
SEALER VESSEL EXT DVNC XI (MISCELLANEOUS) ×1 IMPLANT
SOL ELECTROSURG ANTI STICK (MISCELLANEOUS) ×1 IMPLANT
SOLUTION ELECTROSURG ANTI STCK (MISCELLANEOUS) ×1 IMPLANT
SPIKE FLUID TRANSFER (MISCELLANEOUS) IMPLANT
STAPLER 60 SUREFORM DVNC (STAPLE) IMPLANT
STAPLER ECHELON POWER CIR 29 (STAPLE) IMPLANT
STAPLER ECHELON POWER CIR 31 (STAPLE) IMPLANT
STAPLER RELOAD 2.5X60 WHT DVNC (STAPLE) ×1 IMPLANT
STAPLER RELOAD 3.5X60 BLU DVNC (STAPLE) ×1 IMPLANT
STAPLER RELOAD 4.3X60 GRN DVNC (STAPLE) ×1 IMPLANT
SUT ETHILON 2 0 PS N (SUTURE) IMPLANT
SUT NOVA NAB GS-21 1 T12 (SUTURE) ×2 IMPLANT
SUT PROLENE 2 0 KS (SUTURE) IMPLANT
SUT SILK 2 0 SH CR/8 (SUTURE) IMPLANT
SUT SILK 2-0 18XBRD TIE 12 (SUTURE) ×1 IMPLANT
SUT SILK 3 0 SH CR/8 (SUTURE) ×1 IMPLANT
SUT SILK 3-0 18XBRD TIE 12 (SUTURE) IMPLANT
SUT V-LOC BARB 180 2/0GR6 GS22 (SUTURE) ×3 IMPLANT
SUT VIC AB 2-0 SH 18 (SUTURE) IMPLANT
SUT VIC AB 2-0 SH 27X BRD (SUTURE) IMPLANT
SUT VIC AB 3-0 SH 18 (SUTURE) IMPLANT
SUT VIC AB 4-0 PS2 27 (SUTURE) ×2 IMPLANT
SUT VICRYL 0 UR6 27IN ABS (SUTURE) ×1 IMPLANT
SUTURE V-LC BRB 180 2/0GR6GS22 (SUTURE) IMPLANT
SYR 20ML ECCENTRIC (SYRINGE) ×1 IMPLANT
SYS WOUND ALEXIS 18CM MED (MISCELLANEOUS) IMPLANT
SYSTEM WOUND ALEXIS 18CM MED (MISCELLANEOUS) IMPLANT
TOWEL OR 17X26 10 PK STRL BLUE (TOWEL DISPOSABLE) IMPLANT
TRAY FOLEY MTR SLVR 14FR STAT (SET/KITS/TRAYS/PACK) IMPLANT
TRAY FOLEY MTR SLVR 16FR STAT (SET/KITS/TRAYS/PACK) ×1 IMPLANT
TROCAR ADV FIXATION 5X100MM (TROCAR) ×1 IMPLANT
TUBING CONNECTING 10 (TUBING) ×2 IMPLANT
TUBING INSUFFLATION 10FT LAP (TUBING) ×1 IMPLANT

## 2023-12-16 NOTE — Anesthesia Preprocedure Evaluation (Addendum)
 Anesthesia Evaluation  Patient identified by MRN, date of birth, ID band Patient awake    Reviewed: Allergy & Precautions, NPO status , Patient's Chart, lab work & pertinent test results  Airway Mallampati: II  TM Distance: >3 FB Neck ROM: Full    Dental  (+) Edentulous Upper, Missing   Pulmonary neg pulmonary ROS   Pulmonary exam normal        Cardiovascular hypertension, Pt. on medications Normal cardiovascular exam     Neuro/Psych negative neurological ROS  negative psych ROS   GI/Hepatic Neg liver ROS,GERD  Medicated and Controlled,,  Endo/Other  negative endocrine ROS    Renal/GU Renal disease     Musculoskeletal  (+) Arthritis ,    Abdominal   Peds  Hematology  (+) Blood dyscrasia, anemia   Anesthesia Other Findings COLON CANCER  Reproductive/Obstetrics                             Anesthesia Physical Anesthesia Plan  ASA: 2  Anesthesia Plan: General   Post-op Pain Management:    Induction: Intravenous  PONV Risk Score and Plan: 3 and Ondansetron, Dexamethasone, Propofol infusion and Treatment may vary due to age or medical condition  Airway Management Planned: Oral ETT  Additional Equipment:   Intra-op Plan:   Post-operative Plan: Extubation in OR  Informed Consent: I have reviewed the patients History and Physical, chart, labs and discussed the procedure including the risks, benefits and alternatives for the proposed anesthesia with the patient or authorized representative who has indicated his/her understanding and acceptance.     Dental advisory given  Plan Discussed with: CRNA  Anesthesia Plan Comments:         Anesthesia Quick Evaluation

## 2023-12-16 NOTE — Interval H&P Note (Signed)
 History and Physical Interval Note:  12/16/2023 7:17 AM  Kayla [name]  has presented today for surgery, with the diagnosis of COLON CANCER.  The various methods of treatment have been discussed with the patient and family. After consideration of risks, benefits and other options for treatment, the patient has consented to  Procedure(s): COLECTOMY, PARTIAL, ROBOT-ASSISTED, LAPAROSCOPIC (N/A) as a surgical intervention.  The patient's history has been reviewed, patient examined, no change in status, stable for surgery.  I have reviewed the patient's chart and labs.  Questions were answered to the patient's satisfaction.     Vanita Panda, MD  Colorectal and General Surgery [facility]

## 2023-12-16 NOTE — Op Note (Signed)
 12/16/2023  10:11 AM  PATIENT:  Kayla [name]  89 y.o. female  Patient Care Team: Billie Lade, MD as PCP - General (Internal Medicine) Cindie Crumbly, MD as Medical Oncologist (Medical Oncology) Therese Sarah, RN as Oncology Nurse Navigator (Medical Oncology)  PRE-OPERATIVE DIAGNOSIS:  COLON CANCER  POST-OPERATIVE DIAGNOSIS:  COLON CANCER  PROCEDURE:  ROBOT-ASSISTED RIGHT COLECTOMY    Surgeon(s): Romie Levee, MD Karie Soda, MD  ASSISTANT: Dr Michaell Cowing   ANESTHESIA:   local and general  EBL:60ml  Total I/O In: 100 [IV Piggyback:100] Out: 20 [Blood:20]  Delay start of Pharmacological VTE agent (>24hrs) due to surgical blood loss or risk of bleeding:  no  DRAINS: none   SPECIMEN:  Source of Specimen:  ascending and transverse colon with en bloc omentum and posterior abdominal wall  DISPOSITION OF SPECIMEN:  PATHOLOGY  COUNTS:  YES  PLAN OF CARE: Admit to inpatient   PATIENT DISPOSITION:  PACU - hemodynamically stable.  INDICATION:    89 y.o. F with anemia, found to have large ascending colon cancer.  I recommended segmental resection:  The anatomy & physiology of the digestive tract was discussed.  The pathophysiology was discussed.  Natural history risks without surgery was discussed.   I worked to give an overview of the disease and the frequent need to have multispecialty involvement.  I feel the risks of no intervention will lead to serious problems that outweigh the operative risks; therefore, I recommended a partial colectomy to remove the pathology.  Laparoscopic & open techniques were discussed.   Risks such as bleeding, infection, abscess, leak, reoperation, possible ostomy, hernia, heart attack, death, and other risks were discussed.  I noted a good likelihood this will help address the problem.   Goals of post-operative recovery were discussed as well.    The patient expressed understanding & wished to proceed with surgery.  OR FINDINGS:    Patient had tumor involving the entire ascending colon invading into the posterior aspects of the right lower quadrant abdominal wall  No obvious metastatic disease on visceral parietal peritoneum or liver.  DESCRIPTION:   Informed consent was confirmed.  The patient underwent general anaesthesia without difficulty.  The patient was positioned appropriately.  VTE prevention in place.  The patient's abdomen was clipped, prepped, & draped in a sterile fashion.  Surgical timeout confirmed our plan.  The patient was positioned in reverse Trendelenburg.  Abdominal entry was gained using a Varies needle in the LUQ.  Entry was clean.  I induced carbon dioxide insufflation.  An 8mm robotic port was placed in the LUQ.  Camera inspection revealed no injury.  Extra ports were carefully placed under direct laparoscopic visualization. The patient was appropriately positioned and the robot was docked to the patient's right side.  Instruments were placed under direct visualization.   Upon entry into the abdomen there was a small amount of omentum that was adherent to the abdominal wall.  This was taken down using the vessel sealer.  The colon mass was adherent to the right lower quadrant anterior abdominal wall.  We began by transecting into the peritoneum and posterior fascia to take this down from the abdominal wall.  At its deepest invasion, the rectus muscles were divided also using the robotic vessel sealer.  Once the entire specimen was freed from the abdominal wall it was elevated and the ileocolic pedicle was identified.  I entered into this plane posteriorly.  Dissection was bluntly carried around these structures. The duodenum was  identified and free from the structures. I then separated the structures bluntly and used the robotic vessel sealer device to transect these.  I developed the retroperitoneal plane as much as I could bluntly.  I then freed the cecum off its attachments to the pelvic wall. I  mobilized the terminal ileum.  I took care to avoid injuring any retroperitoneal structures.  After this I began to mobilize laterally down the white line of Toldt and then took down the hepatic flexure using the Enseal device. I mobilized the omentum off of the right transverse colon. The entire colon was then flipped medially and mobilized off of the retroperitoneal structures until I could visualize the lateral edge of the duodenum underneath.  I gently freed the duodenal attachments.  The proximal transverse colon was also adherent to the mass and appeared to be involved.  We decided to do an extended right colectomy.  The right branch of the middle colic was divided using the robotic vessel sealer.  We then continued the dissection until we reached the mid transverse colon.  The mesentery was divided up to this level.  I divided the mesentery of the terminal ileum as well.  Firefly was injected intravenously to confirm good perfusion of the terminal ileum and remaining colon.  Once this was confirmed, the transverse colon was divided using a 60 mm green load robotic stapler.  The terminal ileum was divided using a 60 mm robotic blue load stapler.  The specimen was then completely free and placed in the right upper quadrant. The terminal ileum and transverse colon were oriented and an isoperistaltic fashion.   placed an enterotomy in the small bowel and colon using the robotic scissors.  I then introduced a white load 60 mm robotic stapler into both enterotomies and created an anastomosis between the small bowel and transverse colon.  Hemostasis within the staple line was good.  The common enterotomy channel was closed using 2 running 2-0 V-Loc sutures.  I then used a third V-Loc suture to secure omentum to the abdominal wall dissection to prevent small bowel adhesions.  The abdomen was then irrigated with normal saline. The omentum was then brought down over the anastomosis as well.  At this point the robot  was undocked.  The 12 mm suprapubic port was enlarged to a Pfannenstiel incision and an Alexis wound protector was placed.  The specimen was removed from the abdomen and evaluated.  Once the abdomen was inspected for hemostasis, the Alexis wound protector was removed.    The peritoneum of the Pfannenstiel incision was closed using a running 0 Vicryl suture.  The fascia was then closed using #1 Novafil interrupted sutures.  The subcutaneous tissue of the extraction incision was closed using a running 2-0 Vicryl suture. The skin was then closed using running subcuticular 4-0 Vicryl sutures.  A sterile dressing was applied.  The remaining port sites were closed using interrupted 4-0 Vicryl sutures and Dermabond. All counts were correct per operating room staff. The patient was then awakened from anesthesia and sent to the post anesthesia care unit in stable condition.     Vanita Panda, MD  Colorectal and General Surgery [facility] Surgery

## 2023-12-16 NOTE — Progress Notes (Signed)
 Nurse tech got patient out of bed and walked the entire hallway with roller walker.

## 2023-12-16 NOTE — Transfer of Care (Signed)
 Immediate Anesthesia Transfer of Care Note  Patient: Kayla [name]  Procedure(s) Performed: COLECTOMY, PARTIAL, ROBOT-ASSISTED, LAPAROSCOPIC  Patient Location: PACU  Anesthesia Type:General  Level of Consciousness: drowsy  Airway & Oxygen Therapy: Patient Spontanous Breathing and Patient connected to face mask  Post-op Assessment: Report given to RN and Post -op Vital signs reviewed and stable  Post vital signs: Reviewed and stable  Last Vitals:  Vitals Value Taken Time  BP 137/54 12/16/23 1024  Temp    Pulse 74 12/16/23 1027  Resp 16 12/16/23 1027  SpO2 100 % 12/16/23 1027  Vitals shown include unfiled device data.  Last Pain:  Vitals:   12/16/23 0604  TempSrc:   PainSc: 0-No pain      Patients Stated Pain Goal: 4 (12/16/23 0604)  Complications: No notable events documented.

## 2023-12-16 NOTE — Plan of Care (Signed)
 Progress Note: - Patient came up from the OR with left eye pain. Flush and ketorlac eye drops given in PACU. Patient came to the floor in crying pain unable to open eye. Flushed and eye drops done again on the floor. No improvement. Educated patient that the providers said that this will take 24hours to resolve. I taped left eye with cotton balls to keep left eye closed so patient can open right eye.  Problem: Education: Goal: Understanding of discharge needs will improve Outcome: Progressing Goal: Verbalization of understanding of the causes of altered bowel function will improve Outcome: Progressing   Problem: Activity: Goal: Ability to tolerate increased activity will improve Outcome: Progressing   Problem: Bowel/Gastric: Goal: Gastrointestinal status for postoperative course will improve Outcome: Progressing   Problem: Health Behavior/Discharge Planning: Goal: Identification of community resources to assist with postoperative recovery needs will improve Outcome: Progressing   Problem: Nutritional: Goal: Will attain and maintain optimal nutritional status will improve Outcome: Progressing   Problem: Clinical Measurements: Goal: Postoperative complications will be avoided or minimized Outcome: Progressing   Problem: Respiratory: Goal: Respiratory status will improve Outcome: Progressing   Problem: Skin Integrity: Goal: Will show signs of wound healing Outcome: Progressing   Problem: Education: Goal: Knowledge of General Education information will improve Description: Including pain rating scale, medication(s)/side effects and non-pharmacologic comfort measures Outcome: Progressing   Problem: Health Behavior/Discharge Planning: Goal: Ability to manage health-related needs will improve Outcome: Progressing   Problem: Clinical Measurements: Goal: Ability to maintain clinical measurements within normal limits will improve Outcome: Progressing Goal: Will remain free from  infection Outcome: Progressing Goal: Diagnostic test results will improve Outcome: Progressing Goal: Respiratory complications will improve Outcome: Progressing Goal: Cardiovascular complication will be avoided Outcome: Progressing   Problem: Activity: Goal: Risk for activity intolerance will decrease Outcome: Progressing   Problem: Nutrition: Goal: Adequate nutrition will be maintained Outcome: Progressing   Problem: Coping: Goal: Level of anxiety will decrease Outcome: Progressing   Problem: Elimination: Goal: Will not experience complications related to bowel motility Outcome: Progressing Goal: Will not experience complications related to urinary retention Outcome: Progressing   Problem: Pain Managment: Goal: General experience of comfort will improve and/or be controlled Outcome: Progressing   Problem: Safety: Goal: Ability to remain free from injury will improve Outcome: Progressing   Problem: Skin Integrity: Goal: Risk for impaired skin integrity will decrease Outcome: Progressing

## 2023-12-16 NOTE — Anesthesia Postprocedure Evaluation (Signed)
 Anesthesia Post Note  Patient: Kayla [name]  Procedure(s) Performed: COLECTOMY, PARTIAL, ROBOT-ASSISTED, LAPAROSCOPIC     Patient location during evaluation: PACU Anesthesia Type: General Level of consciousness: awake Pain management: pain level controlled Vital Signs Assessment: post-procedure vital signs reviewed and stable Respiratory status: spontaneous breathing, nonlabored ventilation and respiratory function stable Cardiovascular status: blood pressure returned to baseline and stable Postop Assessment: no apparent nausea or vomiting Anesthetic complications: no   No notable events documented.  Last Vitals:  Vitals:   12/16/23 1258 12/16/23 1333  BP: (!) 116/55 (!) 133/54  Pulse: 72 72  Resp:  18  Temp:  (!) 36.4 C  SpO2: 97% 96%    Last Pain:  Vitals:   12/16/23 1333  TempSrc: Oral  PainSc:                  Kayla [name]

## 2023-12-16 NOTE — Anesthesia Procedure Notes (Signed)
 Procedure Name: Intubation Date/Time: 12/16/2023 7:32 AM  Performed by: Vanessa Vachon, CRNAPre-anesthesia Checklist: Patient identified, Emergency Drugs available, Suction available and Patient being monitored Patient Re-evaluated:Patient Re-evaluated prior to induction Oxygen Delivery Method: Circle system utilized Preoxygenation: Pre-oxygenation with 100% oxygen Induction Type: IV induction Ventilation: Mask ventilation without difficulty Laryngoscope Size: Mac and 3 Grade View: Grade I Tube type: Oral Tube size: 7.0 mm Number of attempts: 1 Airway Equipment and Method: Stylet Placement Confirmation: ETT inserted through vocal cords under direct vision, positive ETCO2 and breath sounds checked- equal and bilateral Secured at: 21 cm Tube secured with: Tape Dental Injury: Teeth and Oropharynx as per pre-operative assessment

## 2023-12-17 LAB — BASIC METABOLIC PANEL
Anion gap: 5 (ref 5–15)
BUN: 12 mg/dL (ref 8–23)
CO2: 26 mmol/L (ref 22–32)
Calcium: 8.1 mg/dL — ABNORMAL LOW (ref 8.9–10.3)
Chloride: 102 mmol/L (ref 98–111)
Creatinine, Ser: 0.82 mg/dL (ref 0.44–1.00)
GFR, Estimated: >60 mL/min (ref >60)
Glucose, Bld: 146 mg/dL — ABNORMAL HIGH (ref 70–99)
Potassium: 4.4 mmol/L (ref 3.5–5.1)
Sodium: 133 mmol/L — ABNORMAL LOW (ref 135–145)

## 2023-12-17 LAB — CBC
HCT: 24.8 % — ABNORMAL LOW (ref 36.0–46.0)
Hemoglobin: 7.4 g/dL — ABNORMAL LOW (ref 12.0–15.0)
MCH: 25.1 pg — ABNORMAL LOW (ref 26.0–34.0)
MCHC: 29.8 g/dL — ABNORMAL LOW (ref 30.0–36.0)
MCV: 84.1 fL (ref 80.0–100.0)
Platelets: 297 10*3/uL (ref 150–400)
RBC: 2.95 MIL/uL — ABNORMAL LOW (ref 3.87–5.11)
RDW: 16.3 % — ABNORMAL HIGH (ref 11.5–15.5)
WBC: 15.3 10*3/uL — ABNORMAL HIGH (ref 4.0–10.5)
nRBC: 0 % (ref 0.0–0.2)

## 2023-12-17 NOTE — Discharge Instructions (Signed)

## 2023-12-17 NOTE — Progress Notes (Signed)
 1 Day Post-Op   Subjective/Chief Complaint: Pt reports less pain this morning Tolerating liquids without nausea Walking in the halls   Objective: Vital signs in last 24 hours: Temp:  [97 F (36.1 C)-97.5 F (36.4 C)] 97.5 F (36.4 C) (03/22 0601) Pulse Rate:  [62-76] 65 (03/22 0601) Resp:  [15-20] 16 (03/22 0601) BP: (113-143)/(47-60) 113/51 (03/22 0601) SpO2:  [96 %-100 %] 97 % (03/22 0601) Weight:  [71.1 kg] 71.1 kg (03/22 0500) Last BM Date : 12/15/23  Intake/Output from previous day: 03/21 0701 - 03/22 0700 In: 1839.8 [P.O.:540; I.V.:1199.8; IV Piggyback:100] Out: 1320 [Urine:1300; Blood:20] Intake/Output this shift: No intake/output data recorded.  Exam: Awake and alert Comfortable Abdomen soft, non-distended, dressing dry  Lab Results:  Recent Labs    12/17/23 0521  WBC 15.3*  HGB 7.4*  HCT 24.8*  PLT 297   BMET Recent Labs    12/17/23 0521  NA 133*  K 4.4  CL 102  CO2 26  GLUCOSE 146*  BUN 12  CREATININE 0.82  CALCIUM 8.1*   PT/INR No results for input(s): "LABPROT", "INR" in the last 72 hours. ABG No results for input(s): "PHART", "HCO3" in the last 72 hours.  Invalid input(s): "PCO2", "PO2"  Studies/Results: No results found.  Anti-infectives: Anti-infectives (From admission, onward)    Start     Dose/Rate Route Frequency Ordered Stop   12/16/23 2000  cefoTEtan (CEFOTAN) 2 g in sodium chloride 0.9 % 100 mL IVPB        2 g 200 mL/hr over 30 Minutes Intravenous Every 12 hours 12/16/23 1254 12/16/23 2011   12/16/23 0600  cefoTEtan (CEFOTAN) 2 g in sodium chloride 0.9 % 100 mL IVPB        2 g 200 mL/hr over 30 Minutes Intravenous On call to O.R. 12/16/23 0539 12/16/23 0754       Assessment/Plan: POD#1 s/p Robotic Right Colectomy with abdominal wall resection  Hgb down a little. Anemic pre-op so suspect dilutional.  Given stable vitals, will hold on transfusion and repeat in the morning  Advance to full liquid diet  I discussed  with the patient and her daughter    LOS: 1 day    Abigail Miyamoto 12/17/2023

## 2023-12-18 LAB — BASIC METABOLIC PANEL
Anion gap: 6 (ref 5–15)
BUN: 14 mg/dL (ref 8–23)
CO2: 25 mmol/L (ref 22–32)
Calcium: 8 mg/dL — ABNORMAL LOW (ref 8.9–10.3)
Chloride: 102 mmol/L (ref 98–111)
Creatinine, Ser: 0.7 mg/dL (ref 0.44–1.00)
GFR, Estimated: >60 mL/min (ref >60)
Glucose, Bld: 102 mg/dL — ABNORMAL HIGH (ref 70–99)
Potassium: 3.7 mmol/L (ref 3.5–5.1)
Sodium: 133 mmol/L — ABNORMAL LOW (ref 135–145)

## 2023-12-18 LAB — CBC
HCT: 25.3 % — ABNORMAL LOW (ref 36.0–46.0)
Hemoglobin: 7.4 g/dL — ABNORMAL LOW (ref 12.0–15.0)
MCH: 24.7 pg — ABNORMAL LOW (ref 26.0–34.0)
MCHC: 29.2 g/dL — ABNORMAL LOW (ref 30.0–36.0)
MCV: 84.3 fL (ref 80.0–100.0)
Platelets: 352 10*3/uL (ref 150–400)
RBC: 3 MIL/uL — ABNORMAL LOW (ref 3.87–5.11)
RDW: 16.8 % — ABNORMAL HIGH (ref 11.5–15.5)
WBC: 13.8 10*3/uL — ABNORMAL HIGH (ref 4.0–10.5)
nRBC: 0 % (ref 0.0–0.2)

## 2023-12-18 MED ORDER — BOOST / RESOURCE BREEZE PO LIQD CUSTOM
1.0000 | Freq: Three times a day (TID) | ORAL | Status: DC
  Administered 2023-12-18 (×2): 1 via ORAL

## 2023-12-18 NOTE — Progress Notes (Signed)
 2 Days Post-Op   Subjective/Chief Complaint: Pt reports less pain this morning but still discomfort with movement/cough around incision Tolerating liquids without nausea Walking in the halls Daughter at [facility]   Objective: Vital signs in last 24 hours: Temp:  [97.4 F (36.3 C)-97.7 F (36.5 C)] 97.5 F (36.4 C) (03/23 0413) Pulse Rate:  [65-74] 74 (03/23 0413) Resp:  [16-18] 18 (03/23 0413) BP: (113-154)/(41-58) 139/58 (03/23 0413) SpO2:  [93 %-98 %] 93 % (03/23 0413) Weight:  [71.1 kg] 71.1 kg (03/23 0707) Last BM Date : 12/18/23  Intake/Output from previous day: 03/22 0701 - 03/23 0700 In: 720 [P.O.:720] Out: 400 [Urine:400] Intake/Output this shift: No intake/output data recorded.  Exam: Awake and alert Comfortable Abdomen soft, non-distended, dressing dry  Lab Results:  Recent Labs    12/17/23 0521 12/18/23 0501  WBC 15.3* 13.8*  HGB 7.4* 7.4*  HCT 24.8* 25.3*  PLT 297 352   BMET Recent Labs    12/17/23 0521 12/18/23 0501  NA 133* 133*  K 4.4 3.7  CL 102 102  CO2 26 25  GLUCOSE 146* 102*  BUN 12 14  CREATININE 0.82 0.70  CALCIUM 8.1* 8.0*   PT/INR No results for input(s): "LABPROT", "INR" in the last 72 hours. ABG No results for input(s): "PHART", "HCO3" in the last 72 hours.  Invalid input(s): "PCO2", "PO2"  Studies/Results: No results found.  Anti-infectives: Anti-infectives (From admission, onward)    Start     Dose/Rate Route Frequency Ordered Stop   12/16/23 2000  cefoTEtan (CEFOTAN) 2 g in sodium chloride 0.9 % 100 mL IVPB        2 g 200 mL/hr over 30 Minutes Intravenous Every 12 hours 12/16/23 1254 12/16/23 2011   12/16/23 0600  cefoTEtan (CEFOTAN) 2 g in sodium chloride 0.9 % 100 mL IVPB        2 g 200 mL/hr over 30 Minutes Intravenous On call to O.R. 12/16/23 0539 12/16/23 0754       Assessment/Plan: POD#2  s/p Robotic Right Colectomy with abdominal wall resection  Hgb stable. Anemic pre-op so suspect dilutional.  Given  stable vitals, will hold on transfusion and repeat in the morning  Advance to soft diet  Doesn't like the ensure; will try breeze shakes Discussed typical discharge diet with pt and daughter  I discussed with the patient and her daughter  Possible home Monday    LOS: 2 days    Kayla [name] 12/18/2023

## 2023-12-18 NOTE — TOC Initial Note (Signed)
 Transition of Care [facility]) - Initial/Assessment Note    Patient Details  Name: Kayla [name] MRN: [id number] Date of Birth: 28-Jul-1934  [facility] CM/SW Contact:    Adrian Prows, RN Phone Number: 12/18/2023, 1:39 PM  Clinical Narrative:                 Sherron Monday w/ pt and dtr Lemmie Evens [phone]) in room; pt says she lives at home; she plans to return at d/c; her dtr will provide transportation; pt verified insurance/PCP; she denies SDOH risks; pt says she has canes and walker; she does not have HH services or home oxygen; TOC will follow.  Expected Discharge Plan: Home/Self Care Barriers to Discharge: Continued Medical Work up   Patient Goals and CMS Choice Patient states their goals for this hospitalization and ongoing recovery are:: home CMS Medicare.gov Compare Post Acute Care list provided to:: Patient   [place] ownership interest in [facility].provided to:: Patient    Expected Discharge Plan and Services   Discharge Planning Services: CM Consult   Living arrangements for the past 2 months: Single Family Home                                      Prior Living Arrangements/Services Living arrangements for the past 2 months: Single Family Home Lives with:: Self Patient language and need for interpreter reviewed:: Yes Do you feel safe going back to the place where you live?: Yes      Need for Family Participation in Patient Care: Yes (Comment) Care giver support system in place?: Yes (comment) Current home services: DME (cane, walker) Criminal Activity/Legal Involvement Pertinent to Current Situation/Hospitalization: No - Comment as needed  Activities of Daily Living   ADL Screening (condition at time of admission) Independently performs ADLs?: Yes (appropriate for developmental age) Is the patient deaf or have difficulty hearing?: No Does the patient have difficulty seeing, even when wearing glasses/contacts?:  No Does the patient have difficulty concentrating, remembering, or making decisions?: No  Permission Sought/Granted Permission sought to share information with : Case Manager Permission granted to share information with : Yes, Verbal Permission Granted  Share Information with NAME: Case Manager     Permission granted to share info w Relationship: Lemmie Evens (dtr) [phone]     Emotional Assessment Appearance:: Appears stated age Attitude/Demeanor/Rapport: Gracious Affect (typically observed): Accepting Orientation: : Oriented to Self, Oriented to Place, Oriented to  Time, Oriented to Situation Alcohol / Substance Use: Not Applicable Psych Involvement: No (comment)  Admission diagnosis:  Colon cancer, ascending (HCC) [C18.2] Patient Active Problem List   Diagnosis Date Noted   Colon cancer, ascending (HCC) 12/16/2023   IDA (iron deficiency anemia) 11/30/2023   Gastritis and gastroduodenitis 11/21/2023   Malignant neoplasm of ascending colon (HCC) 11/21/2023   Colonic mass 11/21/2023   RLQ abdominal mass 11/09/2023   Sprain of right ankle 06/08/2023   Swelling of right foot 06/08/2023   CKD stage 3a, GFR 45-59 ml/min (HCC) 05/19/2023   Hypertension 08/04/2022   Hyperlipidemia 08/04/2022   Multiple lung nodules 08/04/2022   GERD (gastroesophageal reflux disease) 08/04/2022   Encounter for well adult exam with abnormal findings 08/04/2022   Osteopenia 08/04/2022   PCP:  Billie Lade, MD Pharmacy:   [facility] - [place], [place] - [address] [place] [address]  Phone: [phone] Fax: [phone]  [facility] - [place], [place] - [address] [place] [address] Phone: [phone] Fax: [phone]     Social Drivers of Health (SDOH) Social History: SDOH Screenings   Food Insecurity: No Food Insecurity (12/18/2023)  Housing: Low Risk  (12/18/2023)  Transportation  Needs: No Transportation Needs (12/18/2023)  Utilities: Not At Risk (12/18/2023)  Alcohol Screen: Low Risk  (11/08/2023)  Depression (PHQ2-9): Low Risk  (11/30/2023)  Financial Resource Strain: Low Risk  (11/08/2023)  Physical Activity: Insufficiently Active (10/27/2023)  Social Connections: Moderately Isolated (12/16/2023)  Stress: No Stress Concern Present (10/27/2023)  Tobacco Use: Low Risk  (12/16/2023)  Health Literacy: Adequate Health Literacy (11/08/2023)   SDOH Interventions: Food Insecurity Interventions: Intervention Not Indicated, Inpatient TOC Housing Interventions: Intervention Not Indicated, Inpatient TOC Transportation Interventions: Intervention Not Indicated, Inpatient TOC Utilities Interventions: Intervention Not Indicated, Inpatient TOC   Readmission Risk Interventions     No data to display

## 2023-12-18 NOTE — Progress Notes (Signed)
 Mobility Specialist - Progress Note   12/18/23 0933  Mobility  Activity Ambulated independently in hallway;Ambulated with assistance to bathroom  Level of Assistance Modified independent, requires aide device or extra time  Assistive Device Front wheel walker  Distance Ambulated (ft) 500 ft  Activity Response Tolerated well  Mobility Referral Yes  Mobility visit 1 Mobility  Mobility Specialist Start Time (ACUTE ONLY) [id number]  Mobility Specialist Stop Time (ACUTE ONLY) 0932  Mobility Specialist Time Calculation (min) (ACUTE ONLY) 16 min   Pt received in recliner and agreeable to mobility. Prior to ambulating, pt requested assistance to the bathroom. No complaints during session. Pt to recliner after session with all needs met.   [facility]

## 2023-12-18 NOTE — Plan of Care (Signed)
  Problem: Education: Goal: Understanding of discharge needs will improve Outcome: Progressing   Problem: Activity: Goal: Ability to tolerate increased activity will improve Outcome: Progressing   Problem: Bowel/Gastric: Goal: Gastrointestinal status for postoperative course will improve Outcome: Progressing   Problem: Clinical Measurements: Goal: Postoperative complications will be avoided or minimized Outcome: Progressing

## 2023-12-18 NOTE — Plan of Care (Signed)
  Problem: Education: Goal: Understanding of discharge needs will improve Outcome: Progressing Goal: Verbalization of understanding of the causes of altered bowel function will improve Outcome: Progressing

## 2023-12-19 LAB — BASIC METABOLIC PANEL
Anion gap: 6 (ref 5–15)
BUN: 14 mg/dL (ref 8–23)
CO2: 26 mmol/L (ref 22–32)
Calcium: 8.3 mg/dL — ABNORMAL LOW (ref 8.9–10.3)
Chloride: 103 mmol/L (ref 98–111)
Creatinine, Ser: 0.75 mg/dL (ref 0.44–1.00)
GFR, Estimated: >60 mL/min (ref >60)
Glucose, Bld: 100 mg/dL — ABNORMAL HIGH (ref 70–99)
Potassium: 3.6 mmol/L (ref 3.5–5.1)
Sodium: 135 mmol/L (ref 135–145)

## 2023-12-19 LAB — CBC
HCT: 26.5 % — ABNORMAL LOW (ref 36.0–46.0)
Hemoglobin: 7.9 g/dL — ABNORMAL LOW (ref 12.0–15.0)
MCH: 25.2 pg — ABNORMAL LOW (ref 26.0–34.0)
MCHC: 29.8 g/dL — ABNORMAL LOW (ref 30.0–36.0)
MCV: 84.4 fL (ref 80.0–100.0)
Platelets: 382 10*3/uL (ref 150–400)
RBC: 3.14 MIL/uL — ABNORMAL LOW (ref 3.87–5.11)
RDW: 17.1 % — ABNORMAL HIGH (ref 11.5–15.5)
WBC: 12.5 10*3/uL — ABNORMAL HIGH (ref 4.0–10.5)
nRBC: 0 % (ref 0.0–0.2)

## 2023-12-19 NOTE — Discharge Summary (Signed)
 Physician Discharge Summary  Patient ID: Kayla [name] MRN: [id number] DOB/AGE: [birth year] 89 y.o.  Admit date: 12/16/2023 Discharge date: 12/19/2023  Admission Diagnoses: Colon cancer Discharge Diagnoses:  Principal Problem:   Colon cancer, ascending [facility])   Discharged Condition: good  Hospital Course: Patient was admitted to the med surg floor after surgery.  Diet was advanced as tolerated.  Patient began to have bowel function on postop day 2.  By postop day 3, she was tolerating a solid diet and pain was controlled with oral medications.  She was urinating without difficulty and ambulating without assistance.  Patient was felt to be in stable condition for discharge to home.   Consults: None  Significant Diagnostic Studies: labs: cbc, bmet  Treatments: IV hydration, analgesia: acetaminophen, and surgery: Robotic R colectomy  Discharge Exam: Blood pressure (!) 139/56, pulse 78, temperature 97.9 F (36.6 C), temperature source Oral, resp. rate 20, height 5\' 9"  (1.753 m), weight 71.1 kg, SpO2 96%. General appearance: alert and cooperative GI: soft, non-distended Incision/Wound: clean, dry, intact  Disposition: Discharge disposition: 01-Home or Self Care        Allergies as of 12/19/2023   No Known Allergies      Medication List     STOP taking these medications    metroNIDAZOLE 500 MG tablet Commonly known as: FLAGYL   neomycin 500 MG tablet Commonly known as: MYCIFRADIN   ondansetron 4 MG tablet Commonly known as: ZOFRAN   polyethylene glycol powder 17 GM/SCOOP powder Commonly known as: GLYCOLAX/MIRALAX       TAKE these medications    aspirin EC 81 MG tablet Take 81 mg by mouth daily.   famotidine 20 MG tablet Commonly known as: PEPCID TAKE 1 TABLET BY MOUTH TWICE  DAILY What changed: when to take this   Fish Oil + D3 1000-1000 MG-UNIT Caps Take 2 capsules by mouth daily.   GaviLyte-G 236 g solution Generic drug: polyethylene  glycol Take 4,000 mLs by mouth once.   Iron (Ferrous Sulfate) 325 (65 Fe) MG Tabs Take 325 mg by mouth daily. What changed: when to take this   multivitamin with minerals Tabs tablet Take 1 tablet by mouth daily.   valsartan 160 MG tablet Commonly known as: DIOVAN Take 1 tablet (160 mg total) by mouth daily.        Follow-up Information     Romie Levee, MD. Schedule an appointment as soon as possible for a visit in 2 week(s).   Specialties: General Surgery, Colon and Rectal Surgery Contact information: [address] [place] [address] [phone]                 Signed: Vanita Panda 12/19/2023, 9:00 AM

## 2023-12-19 NOTE — Progress Notes (Signed)
 AVS reviewed w/ patient & daughter  who verbalized an understanding. No other questions. PIV x 2 removed as noted. Pt dressed for d/c to home- to bathroom prior to d/c. To lobby via w/c - home w/ daughter.

## 2023-12-19 NOTE — Plan of Care (Signed)
   Problem: Education: Goal: Understanding of discharge needs will improve Outcome: Progressing   Problem: Activity: Goal: Ability to tolerate increased activity will improve Outcome: Progressing

## 2023-12-20 ENCOUNTER — Telehealth: Payer: Self-pay

## 2023-12-20 LAB — SURGICAL PATHOLOGY

## 2023-12-22 ENCOUNTER — Other Ambulatory Visit: Payer: Self-pay

## 2023-12-22 DIAGNOSIS — C182 Malignant neoplasm of ascending colon: Secondary | ICD-10-CM

## 2023-12-23 NOTE — Telephone Encounter (Signed)
 t

## 2023-12-27 LAB — MOLECULAR PATHOLOGY

## 2024-01-06 ENCOUNTER — Inpatient Hospital Stay: Admitting: Oncology

## 2024-01-06 ENCOUNTER — Inpatient Hospital Stay: Attending: Oncology

## 2024-01-06 VITALS — BP 125/57 | HR 69 | Temp 97.5°F | Resp 16 | Wt 152.2 lb

## 2024-01-06 DIAGNOSIS — C182 Malignant neoplasm of ascending colon: Secondary | ICD-10-CM | POA: Insufficient documentation

## 2024-01-06 DIAGNOSIS — Z9049 Acquired absence of other specified parts of digestive tract: Secondary | ICD-10-CM | POA: Insufficient documentation

## 2024-01-06 DIAGNOSIS — D500 Iron deficiency anemia secondary to blood loss (chronic): Secondary | ICD-10-CM | POA: Diagnosis not present

## 2024-01-06 DIAGNOSIS — D509 Iron deficiency anemia, unspecified: Secondary | ICD-10-CM | POA: Diagnosis not present

## 2024-01-06 DIAGNOSIS — Z85038 Personal history of other malignant neoplasm of large intestine: Secondary | ICD-10-CM | POA: Diagnosis not present

## 2024-01-06 LAB — CBC WITH DIFFERENTIAL/PLATELET
Abs Immature Granulocytes: 0.01 10*3/uL (ref 0.00–0.07)
Basophils Absolute: 0.1 10*3/uL (ref 0.0–0.1)
Basophils Relative: 1 %
Eosinophils Absolute: 0.3 10*3/uL (ref 0.0–0.5)
Eosinophils Relative: 6 %
HCT: 32 % — ABNORMAL LOW (ref 36.0–46.0)
Hemoglobin: 9.8 g/dL — ABNORMAL LOW (ref 12.0–15.0)
Immature Granulocytes: 0 %
Lymphocytes Relative: 27 %
Lymphs Abs: 1.5 10*3/uL (ref 0.7–4.0)
MCH: 26.5 pg (ref 26.0–34.0)
MCHC: 30.6 g/dL (ref 30.0–36.0)
MCV: 86.5 fL (ref 80.0–100.0)
Monocytes Absolute: 0.4 10*3/uL (ref 0.1–1.0)
Monocytes Relative: 8 %
Neutro Abs: 3.2 10*3/uL (ref 1.7–7.7)
Neutrophils Relative %: 58 %
Platelets: 362 10*3/uL (ref 150–400)
RBC: 3.7 MIL/uL — ABNORMAL LOW (ref 3.87–5.11)
RDW: 20.5 % — ABNORMAL HIGH (ref 11.5–15.5)
WBC: 5.5 10*3/uL (ref 4.0–10.5)
nRBC: 0 % (ref 0.0–0.2)

## 2024-01-06 LAB — COMPREHENSIVE METABOLIC PANEL WITH GFR
ALT: 15 U/L (ref 0–44)
AST: 18 U/L (ref 15–41)
Albumin: 3.4 g/dL — ABNORMAL LOW (ref 3.5–5.0)
Alkaline Phosphatase: 80 U/L (ref 38–126)
Anion gap: 9 (ref 5–15)
BUN: 39 mg/dL — ABNORMAL HIGH (ref 8–23)
CO2: 28 mmol/L (ref 22–32)
Calcium: 9.3 mg/dL (ref 8.9–10.3)
Chloride: 99 mmol/L (ref 98–111)
Creatinine, Ser: 0.72 mg/dL (ref 0.44–1.00)
GFR, Estimated: >60 mL/min (ref >60)
Glucose, Bld: 92 mg/dL (ref 70–99)
Potassium: 4.3 mmol/L (ref 3.5–5.1)
Sodium: 136 mmol/L (ref 135–145)
Total Bilirubin: 0.4 mg/dL (ref 0.0–1.2)
Total Protein: 6.9 g/dL (ref 6.5–8.1)

## 2024-01-06 LAB — IRON AND TIBC
Iron: 54 ug/dL (ref 28–170)
Saturation Ratios: 12 % (ref 10.4–31.8)
TIBC: 437 ug/dL (ref 250–450)
UIBC: 383 ug/dL

## 2024-01-06 LAB — FOLATE: Folate: 18.3 ng/mL (ref >5.9)

## 2024-01-06 LAB — FERRITIN: Ferritin: 135 ng/mL (ref 11–307)

## 2024-01-06 LAB — VITAMIN B12: Vitamin B-12: 540 pg/mL (ref 180–914)

## 2024-01-06 NOTE — Progress Notes (Signed)
 Patient Care Team: Billie Lade, MD as PCP - General (Internal Medicine) Cindie Crumbly, MD as Medical Oncologist (Medical Oncology) Therese Sarah, RN as Oncology Nurse Navigator (Medical Oncology)  Clinic Day:  01/06/2024  Referring physician: Billie Lade, MD   CHIEF COMPLAINT:  CC: Stage IIc colon carcinoma   ASSESSMENT & PLAN:   Assessment & Plan: Kayla [name]  is a 89 y.o. female with stage IIc colon carcinoma  Malignant neoplasm of ascending colon (HCC) Stage IIc right-sided colon cancer s/p hemicolectomy.  Low risk. MLH1, PMS2: Loss of nuclear expression.  MSI-H No positive margins, no lymph node involvement. 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  If positive can consider for chemotherapy and discussed about the same - Patient has low risk stage II colon cancer, per NCCN guidelines can consider observation versus chemotherapy with oxaliplatin based regimen.  Considering patient's age and patient's preference would prefer observation at this time.  Return to clinic in 1 month to discuss results and further management  IDA (iron deficiency anemia) Iron deficiency anemia likely secondary to GI bleed.   S/p 1 dose of IV Monoferric. Improvement in iron panel and hemoglobin.  - Will administer 1 more dose of IV Monoferric.  Risk versus benefits discussed in detail and adverse effects discussed including allergic reactions, headaches and nausea - Continue iron pills every other day.  Take MiraLAX for constipation  Recheck labs in 1 month    The patient understands the plans discussed today and is in agreement with them.  She knows to contact our office if she develops concerns prior to her next appointment.  I provided 30 minutes of face-to-face time during this encounter and > 50% was spent counseling as documented under my  assessment and plan.    Cindie Crumbly, MD  [facility] [place] - A DEPT OF [facility]igh[facility] [address] [place] [address] Dept: [phone] Dept Fax: [phone]     Orders Placed This Encounter  Procedures   Ferritin    Standing Status:   Future    Expected Date:   01/30/2024    Expiration Date:   01/05/2025   Folate    Standing Status:   Future    Expected Date:   01/30/2024    Expiration Date:   01/05/2025   Vitamin B12    Standing Status:   Future    Expected Date:   01/30/2024    Expiration Date:   01/05/2025   CBC with Differential/Platelet    Standing Status:   Future    Expected Date:   01/30/2024    Expiration Date:   01/05/2025   Comprehensive metabolic panel with GFR    Standing Status:   Future    Expected Date:   01/30/2024    Expiration Date:   01/05/2025   Iron and TIBC    Standing Status:   Future    Expected Date:   01/30/2024    Expiration Date:   01/05/2025   CEA    Standing Status:   Future    Expected Date:   01/30/2024    Expiration Date:   01/05/2025     ONCOLOGY HISTORY:   Oncology History  Malignant neoplasm of ascending colon (HCC)  11/11/2023 Imaging   CT A&P wo contrast:  Large irregular mass lesion in the ascending colon as described consistent with colonic neoplasm till proven otherwise.    11/21/2023 Initial Diagnosis   Malignant neoplasm of ascending colon (HCC)   11/21/2023 Procedure   Colonoscopy:  Malignant partially obstructing tumor in the entire length of ascending colon. Note cecum was visualized from far endoscopically and was not intubated given friability and mass compressing against the cecum hence could be involving the cecum.  - One 2 mm polyp in the sigmoid colon, removed with a cold biopsy forceps. Resected and retrieved.   11/21/2023 Pathology Results   COLON, ASCENDING MASS, BIOPSY:  Poorly differentiated adenocarcinoma with signet ring cells.   COLON, SIGMOID,  POLYPECTOMY: Hyperplastic polyp.   IHC EXPRESSION RESULTS  TEST           RESULT MLH1:          LOSS OF NUCLEAR EXPRESSION MSH2:          Preserved nuclear expression MSH6:          Preserved nuclear expression PMS2:          LOSS OF NUCLEAR EXPRESSION    11/29/2023 Imaging   CT CAP w contrast:  1. Short segment irregular wall thickening of the ascending colon with adjacent lobular fat stranding, compatible with known primary colonic neoplasm. 2. Enlarged ileocolic lymph nodes measure up to 11 mm in short axis, compatible for nodal disease involvement. 3. Prominent lymph nodes along the mesenteric root and retroperitoneum are nonspecific and may be reactive or reflect nodal disease involvement. 4. No evidence of metastatic disease in the chest. 5. Stable 11 mm ground-glass pulmonary nodule in the right upper lobe, suggest continued attention on follow-up imaging.   12/05/2023 Tumor Marker   Patient's tumor was tested for the following markers: CEA. Results of the tumor marker test revealed 9.3.   12/16/2023 Procedure   Right hemicolectomy   12/16/2023 Pathology Results   FINAL MICROSCOPIC DIAGNOSIS:  A. COLON, ASCENDING, PROXIMAL, TRANSVERSE, COLECTOMY: - Invasive poorly differentiated adenocarcinoma, 14 cm, circumferentially involving ascending colon and ileocecal valve - Carcinoma invades into subserosa of the adherent abdominal wall - Tumor is adherent to transverse colon but does not show definite evidence of invasion into the transverse colon wall - Resection margins are negative for carcinoma - Negative for lymphovascular or perineural invasion - Twenty-eight benign lymph nodes, negative for carcinoma (0/28) - Benign unremarkable appendix; - See oncology table  ONCOLOGY TABLE:    COLON AND RECTUM, CARCINOMA:  Resection, Including Transanal Disk Excision of Rectal Neoplasms  Procedure: Resection, ascending, proximal and transverse colon Tumor Site: Ascending colon Tumor  Size: 14 cm Macroscopic Tumor Perforation: Not identified Histologic Type: Adenocarcinoma Histologic Grade: G3: Poorly differentiated Multiple Primary Sites: Not applicable Tumor Extension: Tumor invades into subserosa of adherent abdominal wall Lymphovascular Invasion: Not identified Perineural Invasion: Not identified Treatment Effect: No known presurgical therapy Margins:      Margin Status for Invasive Carcinoma: All margins negative for invasive carcinoma      Margin Status for Non-Invasive Tumor: All margins negative for high-grade dysplasia / intramucosal carcinoma and low-grade dysplasia  Regional Lymph Nodes:      Number of Lymph Nodes with Tumor: 0      Number of Lymph Nodes Examined: 28 Tumor Deposits: Not identified  Pathologic Stage Classification (pTNM, AJCC 8th Edition): pT4b, pN0 Ancillary Studies: Previous biopsy from the tumor showed loss of expression of MMR-related proteins MLH1 and PMS2    12/16/2023 Cancer Staging   Staging  form: Colon and Rectum, AJCC 8th Edition - Clinical stage from 12/16/2023: Stage IIC (cT4b, cN0, cM0) - Signed by Cindie Crumbly, MD on 01/06/2024 Histopathologic type: Adenocarcinoma, NOS Stage prefix: Initial diagnosis Total positive nodes: 0 Total nodes examined: 28 Histologic grade (G): G3 Histologic grading system: 4 grade system Laterality: Right Lymph-vascular invasion (LVI): LVI not present (absent)/not identified Tumor deposits (TD): Absent Carcinoembryonic antigen (CEA) (ng/mL): 9.3 Perineural invasion (PNI): Absent Microsatellite instability (MSI): Unstable high KRAS mutation: Not assessed NRAS mutation: Not assessed BRAF mutation: Unknown       Current Treatment: Observation  INTERVAL HISTORY:  Kayla [name] is here today for follow up. Patient is accompanied by by her daughter today.  She is following up after surgery.She reports feeling "pretty good" but not at "a hundred percent". She has been recovering from  the surgery and has not experienced any significant complications. The patient mentions a slight soreness in the abdominal area, but no other symptoms or discomfort. She also reports a red spot that appeared a couple of days ago near the surgical site, but it does not seem to be causing any distress or discomfort.  Patient stated that she did not find a significant improvement after IV iron infusion.  But daughter stated that she has been showing more interest in activities and has been considering resuming some of her usual activities such as gardening   I have reviewed the past medical history, past surgical history, social history and family history with the patient and they are unchanged from previous note.  ALLERGIES:  has no known allergies.  MEDICATIONS:  Current Outpatient Medications  Medication Sig Dispense Refill   aspirin EC 81 MG tablet Take 81 mg by mouth daily.     famotidine (PEPCID) 20 MG tablet TAKE 1 TABLET BY MOUTH TWICE  DAILY (Patient taking differently: Take 20 mg by mouth in the morning.) 180 tablet 3   Fish Oil-Cholecalciferol (FISH OIL + D3) 1000-1000 MG-UNIT CAPS Take 2 capsules by mouth daily.     Iron, Ferrous Sulfate, 325 (65 Fe) MG TABS Take 325 mg by mouth daily. (Patient taking differently: Take 325 mg by mouth every other day.) 30 tablet 3   Multiple Vitamin (MULTIVITAMIN WITH MINERALS) TABS tablet Take 1 tablet by mouth daily.     valsartan (DIOVAN) 160 MG tablet Take 1 tablet (160 mg total) by mouth daily. 90 tablet 3   No current facility-administered medications for this visit.       REVIEW OF SYSTEMS:   Constitutional: Denies fevers, chills or abnormal weight loss Eyes: Denies blurriness of vision Ears, nose, mouth, throat, and face: Denies mucositis or sore throat Respiratory: Denies cough, dyspnea or wheezes Cardiovascular: Denies palpitation, chest discomfort or lower extremity swelling Gastrointestinal:  Denies nausea, heartburn or change in  bowel habits Skin: Denies abnormal skin rashes Lymphatics: Denies new lymphadenopathy or easy bruising Neurological:Denies numbness, tingling or new weaknesses Behavioral/Psych: Mood is stable, no new changes  All other systems were reviewed with the patient and are negative.   VITALS:  Blood pressure (!) 125/57, pulse 69, temperature (!) 97.5 F (36.4 C), temperature source Oral, resp. rate 16, weight 152 lb 3.2 oz (69 kg), SpO2 100%.  Wt Readings from Last 3 Encounters:  01/06/24 152 lb 3.2 oz (69 kg)  12/18/23 156 lb 12 oz (71.1 kg)  12/13/23 155 lb (70.3 kg)    Body mass index is 22.48 kg/m.  Performance status (ECOG): 0 - Asymptomatic  PHYSICAL EXAM:  GENERAL:alert, no distress and comfortable SKIN: skin color, texture, turgor are normal, no rashes or significant lesions LUNGS: clear to auscultation and percussion with normal breathing effort HEART: regular rate & rhythm and no murmurs and no lower extremity edema ABDOMEN:abdomen soft, non-tender and normal bowel sounds, surgical site clean, small erythematous spot but no pus or drainage. Musculoskeletal:no cyanosis of digits and no clubbing  NEURO: alert & oriented x 3 with fluent speech  LABORATORY DATA:  I have reviewed the data as listed    Component Value Date/Time   NA 136 01/06/2024 0757   NA 140 10/31/2023 0942   K 4.3 01/06/2024 0757   CL 99 01/06/2024 0757   CO2 28 01/06/2024 0757   GLUCOSE 92 01/06/2024 0757   BUN 39 (H) 01/06/2024 0757   BUN 18 10/31/2023 0942   CREATININE 0.72 01/06/2024 0757   CALCIUM 9.3 01/06/2024 0757   PROT 6.9 01/06/2024 0757   PROT 6.0 10/31/2023 0942   ALBUMIN 3.4 (L) 01/06/2024 0757   ALBUMIN 3.7 10/31/2023 0942   AST 18 01/06/2024 0757   ALT 15 01/06/2024 0757   ALKPHOS 80 01/06/2024 0757   BILITOT 0.4 01/06/2024 0757   BILITOT <0.2 10/31/2023 0942   GFRNONAA >60 01/06/2024 0757   GFRAA >60 08/02/2019 0309    Lab Results  Component Value Date   WBC 5.5  01/06/2024   NEUTROABS 3.2 01/06/2024   HGB 9.8 (L) 01/06/2024   HCT 32.0 (L) 01/06/2024   MCV 86.5 01/06/2024   PLT 362 01/06/2024      Chemistry      Component Value Date/Time   NA 136 01/06/2024 0757   NA 140 10/31/2023 0942   K 4.3 01/06/2024 0757   CL 99 01/06/2024 0757   CO2 28 01/06/2024 0757   BUN 39 (H) 01/06/2024 0757   BUN 18 10/31/2023 0942   CREATININE 0.72 01/06/2024 0757      Component Value Date/Time   CALCIUM 9.3 01/06/2024 0757   ALKPHOS 80 01/06/2024 0757   AST 18 01/06/2024 0757   ALT 15 01/06/2024 0757   BILITOT 0.4 01/06/2024 0757   BILITOT <0.2 10/31/2023 0942      Latest Reference Range & Units 01/06/24 07:56  Iron 28 - 170 ug/dL 54  UIBC ug/dL 782  TIBC 956 - 213 ug/dL 086  Saturation Ratios 10.4 - 31.8 % 12  Ferritin 11 - 307 ng/mL 135  Folate >5.9 ng/mL 18.3  Vitamin B12 180 - 914 pg/mL 540    Latest Reference Range & Units 12/05/23 08:58  CEA 0.0 - 4.7 ng/mL 9.3 (H)  (H): Data is abnormally high  Pathology:  Pathology Results  FINAL MICROSCOPIC DIAGNOSIS:  A. COLON, ASCENDING, PROXIMAL, TRANSVERSE, COLECTOMY: - Invasive poorly differentiated adenocarcinoma, 14 cm, circumferentially involving ascending colon and ileocecal valve - Carcinoma invades into subserosa of the adherent abdominal wall - Tumor is adherent to transverse colon but does not show definite evidence of invasion into the transverse colon wall - Resection margins are negative for carcinoma - Negative for lymphovascular or perineural invasion - Twenty-eight benign lymph nodes, negative for carcinoma (0/28) - Benign unremarkable appendix; - See oncology table  ONCOLOGY TABLE:    COLON AND RECTUM, CARCINOMA:  Resection, Including Transanal Disk Excision of Rectal Neoplasms  Procedure: Resection, ascending, proximal and transverse colon Tumor Site: Ascending colon Tumor Size: 14 cm Macroscopic Tumor Perforation: Not identified Histologic Type:  Adenocarcinoma Histologic Grade: G3: Poorly differentiated Multiple Primary Sites: Not applicable Tumor Extension: Tumor invades  into subserosa of adherent abdominal wall Lymphovascular Invasion: Not identified Perineural Invasion: Not identified Treatment Effect: No known presurgical therapy Margins:      Margin Status for Invasive Carcinoma: All margins negative for invasive carcinoma      Margin Status for Non-Invasive Tumor: All margins negative for high-grade dysplasia / intramucosal carcinoma and low-grade dysplasia  Regional Lymph Nodes:      Number of Lymph Nodes with Tumor: 0      Number of Lymph Nodes Examined: 28 Tumor Deposits: Not identified  Pathologic Stage Classification (pTNM, AJCC 8th Edition): pT4b, pN0 Ancillary Studies: Previous biopsy from the tumor showed loss of expression of MMR-related proteins MLH1 and PMS2

## 2024-01-06 NOTE — Assessment & Plan Note (Signed)
 Iron deficiency anemia likely secondary to GI bleed.   S/p 1 dose of IV Monoferric. Improvement in iron panel and hemoglobin.  - Will administer 1 more dose of IV Monoferric.  Risk versus benefits discussed in detail and adverse effects discussed including allergic reactions, headaches and nausea - Continue iron pills every other day.  Take MiraLAX for constipation  Recheck labs in 1 month

## 2024-01-06 NOTE — Assessment & Plan Note (Signed)
 Stage IIc right-sided colon cancer s/p hemicolectomy.  Low risk. MLH1, PMS2: Loss of nuclear expression.  MSI-H No positive margins, no lymph node involvement. 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  If positive can consider for chemotherapy and discussed about the same - Patient has low risk stage II colon cancer, per NCCN guidelines can consider observation versus chemotherapy with oxaliplatin based regimen.  Considering patient's age and patient's preference would prefer observation at this time.  Return to clinic in 1 month to discuss results and further management

## 2024-01-10 ENCOUNTER — Inpatient Hospital Stay

## 2024-01-10 VITALS — BP 113/44 | HR 59 | Temp 96.2°F | Resp 16

## 2024-01-10 DIAGNOSIS — C182 Malignant neoplasm of ascending colon: Secondary | ICD-10-CM | POA: Diagnosis not present

## 2024-01-10 DIAGNOSIS — D509 Iron deficiency anemia, unspecified: Secondary | ICD-10-CM | POA: Diagnosis not present

## 2024-01-10 DIAGNOSIS — D500 Iron deficiency anemia secondary to blood loss (chronic): Secondary | ICD-10-CM

## 2024-01-10 DIAGNOSIS — Z9049 Acquired absence of other specified parts of digestive tract: Secondary | ICD-10-CM | POA: Diagnosis not present

## 2024-01-10 LAB — MOLECULAR PATHOLOGY

## 2024-01-10 MED ORDER — SODIUM CHLORIDE 0.9 % IV SOLN: INTRAVENOUS | Status: DC

## 2024-01-10 MED ORDER — CETIRIZINE HCL 10 MG/ML IV SOLN
10.0000 mg | Freq: Once | INTRAVENOUS | Status: AC
  Administered 2024-01-10: 10 mg via INTRAVENOUS
  Filled 2024-01-10: qty 1

## 2024-01-10 MED ORDER — ACETAMINOPHEN 325 MG PO TABS
650.0000 mg | ORAL_TABLET | Freq: Once | ORAL | Status: AC
  Administered 2024-01-10: 650 mg via ORAL
  Filled 2024-01-10: qty 2

## 2024-01-10 MED ORDER — SODIUM CHLORIDE 0.9 % IV SOLN
1000.0000 mg | Freq: Once | INTRAVENOUS | Status: AC
  Administered 2024-01-10: 1000 mg via INTRAVENOUS
  Filled 2024-01-10: qty 1000

## 2024-01-10 NOTE — Patient Instructions (Signed)
 [facility] [place] - A DEPT OF [facility]  Discharge Instructions: Thank you for choosing [facility] to provide your oncology and hematology care.  If you have a lab appointment with the Cancer Center - please note that after April 8th, 2024, all labs will be drawn in the cancer center.  You do not have to check in or register with the main entrance as you have in the past but will complete your check-in in the cancer center.  Wear comfortable clothing and clothing appropriate for easy access to any Portacath or PICC line.   We strive to give you quality time with your provider. You may need to reschedule your appointment if you arrive late (15 or more minutes).  Arriving late affects you and other patients whose appointments are after yours.  Also, if you miss three or more appointments without notifying the office, you may be dismissed from the clinic at the provider's discretion.      For prescription refill requests, have your pharmacy contact our office and allow 72 hours for refills to be completed.    Today you received the following Monoferric, return as scheduled.    To help prevent nausea and vomiting after your treatment, we encourage you to take your nausea medication as directed.  BELOW ARE SYMPTOMS THAT SHOULD BE REPORTED IMMEDIATELY: *FEVER GREATER THAN 100.4 F (38 C) OR HIGHER *CHILLS OR SWEATING *NAUSEA AND VOMITING THAT IS NOT CONTROLLED WITH YOUR NAUSEA MEDICATION *UNUSUAL SHORTNESS OF BREATH *UNUSUAL BRUISING OR BLEEDING *URINARY PROBLEMS (pain or burning when urinating, or frequent urination) *BOWEL PROBLEMS (unusual diarrhea, constipation, pain near the anus) TENDERNESS IN MOUTH AND THROAT WITH OR WITHOUT PRESENCE OF ULCERS (sore throat, sores in mouth, or a toothache) UNUSUAL RASH, SWELLING OR PAIN  UNUSUAL VAGINAL DISCHARGE OR ITCHING   Items with * indicate a potential emergency and should be followed up as soon as possible  or go to the Emergency Department if any problems should occur.  Please show the CHEMOTHERAPY ALERT CARD or IMMUNOTHERAPY ALERT CARD at check-in to the Emergency Department and triage nurse.  Should you have questions after your visit or need to cancel or reschedule your appointment, please contact [facility] [place] - A DEPT OF Eligha Brideg[facility] [phone]  and follow the prompts.  Office hours are 8:00 a.m. to 4:30 p.m. Monday - Friday. Please note that voicemails left after 4:00 p.m. may not be returned until the following business day.  We are closed weekends and major holidays. You have access to a nurse at all times for urgent questions. Please call the main number to the clinic [phone] and follow the prompts.  For any non-urgent questions, you may also contact your provider using MyChart. We now offer e-Visits for anyone 26 and older to request care online for non-urgent symptoms. For details visit [email or web address].   Also download the MyChart app! Go to the app store, search "MyChart", open the app, select [facility], and log in with your MyChart username and password.

## 2024-01-10 NOTE — Progress Notes (Signed)
 Patient tolerated iron infusion with no complaints voiced.  Peripheral IV site clean and dry with good blood return noted before and after infusion.  Band aid applied.  VSS with discharge and left in satisfactory condition with no s/s of distress noted.

## 2024-01-11 ENCOUNTER — Other Ambulatory Visit: Payer: Self-pay

## 2024-01-18 ENCOUNTER — Encounter: Payer: Self-pay | Admitting: Oncology

## 2024-01-18 NOTE — Progress Notes (Signed)
 The proposed treatment discussed in conference is for discussion purpose only and is not a binding recommendation.  The patients have not been physically examined, or presented with their treatment options.  Therefore, final treatment plans cannot be decided.

## 2024-01-19 DIAGNOSIS — L570 Actinic keratosis: Secondary | ICD-10-CM | POA: Diagnosis not present

## 2024-01-19 DIAGNOSIS — L814 Other melanin hyperpigmentation: Secondary | ICD-10-CM | POA: Diagnosis not present

## 2024-01-19 DIAGNOSIS — L821 Other seborrheic keratosis: Secondary | ICD-10-CM | POA: Diagnosis not present

## 2024-01-19 DIAGNOSIS — L720 Epidermal cyst: Secondary | ICD-10-CM | POA: Diagnosis not present

## 2024-01-27 ENCOUNTER — Inpatient Hospital Stay: Attending: Oncology

## 2024-01-27 DIAGNOSIS — D509 Iron deficiency anemia, unspecified: Secondary | ICD-10-CM | POA: Diagnosis not present

## 2024-01-27 DIAGNOSIS — C182 Malignant neoplasm of ascending colon: Secondary | ICD-10-CM | POA: Diagnosis not present

## 2024-01-27 DIAGNOSIS — D500 Iron deficiency anemia secondary to blood loss (chronic): Secondary | ICD-10-CM

## 2024-01-27 LAB — CBC WITH DIFFERENTIAL/PLATELET
Abs Immature Granulocytes: 0.01 10*3/uL (ref 0.00–0.07)
Basophils Absolute: 0.1 10*3/uL (ref 0.0–0.1)
Basophils Relative: 1 %
Eosinophils Absolute: 0.3 10*3/uL (ref 0.0–0.5)
Eosinophils Relative: 4 %
HCT: 35.3 % — ABNORMAL LOW (ref 36.0–46.0)
Hemoglobin: 11.2 g/dL — ABNORMAL LOW (ref 12.0–15.0)
Immature Granulocytes: 0 %
Lymphocytes Relative: 28 %
Lymphs Abs: 1.9 10*3/uL (ref 0.7–4.0)
MCH: 27.9 pg (ref 26.0–34.0)
MCHC: 31.7 g/dL (ref 30.0–36.0)
MCV: 88 fL (ref 80.0–100.0)
Monocytes Absolute: 0.3 10*3/uL (ref 0.1–1.0)
Monocytes Relative: 5 %
Neutro Abs: 4.1 10*3/uL (ref 1.7–7.7)
Neutrophils Relative %: 62 %
Platelets: 208 10*3/uL (ref 150–400)
RBC: 4.01 MIL/uL (ref 3.87–5.11)
RDW: 19.7 % — ABNORMAL HIGH (ref 11.5–15.5)
WBC: 6.7 10*3/uL (ref 4.0–10.5)
nRBC: 0 % (ref 0.0–0.2)

## 2024-01-27 LAB — COMPREHENSIVE METABOLIC PANEL WITH GFR
ALT: 17 U/L (ref 0–44)
AST: 21 U/L (ref 15–41)
Albumin: 3.6 g/dL (ref 3.5–5.0)
Alkaline Phosphatase: 79 U/L (ref 38–126)
Anion gap: 8 (ref 5–15)
BUN: 26 mg/dL — ABNORMAL HIGH (ref 8–23)
CO2: 26 mmol/L (ref 22–32)
Calcium: 9.1 mg/dL (ref 8.9–10.3)
Chloride: 103 mmol/L (ref 98–111)
Creatinine, Ser: 0.72 mg/dL (ref 0.44–1.00)
GFR, Estimated: >60 mL/min (ref >60)
Glucose, Bld: 90 mg/dL (ref 70–99)
Potassium: 3.9 mmol/L (ref 3.5–5.1)
Sodium: 137 mmol/L (ref 135–145)
Total Bilirubin: 0.2 mg/dL (ref 0.0–1.2)
Total Protein: 6.6 g/dL (ref 6.5–8.1)

## 2024-01-27 LAB — IRON AND TIBC
Iron: 75 ug/dL (ref 28–170)
Saturation Ratios: 21 % (ref 10.4–31.8)
TIBC: 362 ug/dL (ref 250–450)
UIBC: 287 ug/dL

## 2024-01-27 LAB — FOLATE: Folate: 29 ng/mL (ref >5.9)

## 2024-01-27 LAB — VITAMIN B12: Vitamin B-12: 548 pg/mL (ref 180–914)

## 2024-01-27 LAB — FERRITIN: Ferritin: 300 ng/mL (ref 11–307)

## 2024-01-28 LAB — CEA: CEA: 2.3 ng/mL (ref 0.0–4.7)

## 2024-02-02 LAB — SIGNATERA

## 2024-02-03 ENCOUNTER — Inpatient Hospital Stay (HOSPITAL_BASED_OUTPATIENT_CLINIC_OR_DEPARTMENT_OTHER): Admitting: Oncology

## 2024-02-03 VITALS — BP 126/66 | HR 61 | Temp 98.3°F | Resp 18 | Wt 153.3 lb

## 2024-02-03 DIAGNOSIS — D500 Iron deficiency anemia secondary to blood loss (chronic): Secondary | ICD-10-CM | POA: Diagnosis not present

## 2024-02-03 DIAGNOSIS — C182 Malignant neoplasm of ascending colon: Secondary | ICD-10-CM | POA: Diagnosis not present

## 2024-02-03 DIAGNOSIS — D509 Iron deficiency anemia, unspecified: Secondary | ICD-10-CM | POA: Diagnosis not present

## 2024-02-03 NOTE — Progress Notes (Signed)
 Patient Care Team: Tobi Fortes, MD as PCP - General (Internal Medicine) Eduardo Grade, MD as Medical Oncologist (Medical Oncology) Gerhard Knuckles, RN as Oncology Nurse Navigator (Medical Oncology)  Clinic Day:  02/03/2024  Referring physician: Tobi Fortes, MD   CHIEF COMPLAINT:  CC: Stage IIc colon carcinoma   ASSESSMENT & PLAN:   Assessment & Plan: Kayla [name]  is a 89 y.o. female with stage IIc colon carcinoma  Malignant neoplasm of ascending colon (HCC) Stage IIc right-sided colon cancer s/p hemicolectomy.  Low risk. MLH1, PMS2: Loss of nuclear expression.  MSI-H. BRAF: positive No positive margins, no lymph node involvement. CT CAP: No sites of metastasis CT DNA: Pending.  Patient does not want chemotherapy even if this is positive  - Will proceed with observation as per patient's preference - Surveillance as per NCCN guidelines: -H&E every 3 to 6 months for 2 years, then every 6 months for a total of 5 years -CEA every 3 to 6 months for 2 years, then every 6 months for a total of 5 years -CT CAP every 6 to 12 months from the date of surgery for a total of 5 years -Colonoscopy in 1 year after surgery  - CEA from 01/27/2024: Normal 2.7 - CT CAP due in 05/2024  Return to clinic in 3 months with labs  IDA (iron  deficiency anemia) Iron  deficiency anemia likely secondary to GI bleed.   S/p 2 doses of IV Monoferric . Improvement in iron  panel and hemoglobin.  - Continue iron  pills every other day.  Take MiraLAX  for constipation  Recheck labs in 3 months   The patient understands the plans discussed today and is in agreement with them.  She knows to contact our office if she develops concerns prior to her next appointment.  I provided 30 minutes of face-to-face time during this encounter and > 50% was spent counseling as documented under my assessment and plan.    Eduardo Grade, MD  [facility] [place] - A DEPT OF  [facility] [address] [place] [address] Dept: [phone] Dept Fax: [phone]     Orders Placed This Encounter  Procedures   Ferritin    Standing Status:   Future    Expected Date:   04/30/2024    Expiration Date:   02/02/2025   Folate    Standing Status:   Future    Expected Date:   04/30/2024    Expiration Date:   02/02/2025   Vitamin B12    Standing Status:   Future    Expected Date:   04/30/2024    Expiration Date:   02/02/2025   CBC with Differential/Platelet    Standing Status:   Future    Expected Date:   04/30/2024    Expiration Date:   02/02/2025   Comprehensive metabolic panel with GFR    Standing Status:   Future    Expected Date:   04/30/2024    Expiration Date:   02/02/2025   Iron  and TIBC    Standing Status:   Future    Expected Date:   04/30/2024    Expiration Date:   02/02/2025   CEA    Standing Status:   Future    Expected Date:   04/30/2024    Expiration Date:   02/02/2025     ONCOLOGY HISTORY:   Oncology History  Malignant neoplasm of ascending colon (HCC)  11/11/2023 Imaging  CT A&P wo contrast:  Large irregular mass lesion in the ascending colon as described consistent with colonic neoplasm till proven otherwise.    11/21/2023 Initial Diagnosis   Malignant neoplasm of ascending colon (HCC)   11/21/2023 Procedure   Colonoscopy:  Malignant partially obstructing tumor in the entire length of ascending colon. Note cecum was visualized from far endoscopically and was not intubated given friability and mass compressing against the cecum hence could be involving the cecum.  - One 2 mm polyp in the sigmoid colon, removed with a cold biopsy forceps. Resected and retrieved.   11/21/2023 Pathology Results   COLON, ASCENDING MASS, BIOPSY:  Poorly differentiated adenocarcinoma with signet ring cells.   COLON, SIGMOID, POLYPECTOMY: Hyperplastic polyp.   IHC EXPRESSION RESULTS  TEST           RESULT MLH1:          LOSS OF NUCLEAR  EXPRESSION MSH2:          Preserved nuclear expression MSH6:          Preserved nuclear expression PMS2:          LOSS OF NUCLEAR EXPRESSION   BRAF V600E: Mutation detected in BRAF Exon 15; p.V600E   11/29/2023 Imaging   CT CAP w contrast:  1. Short segment irregular wall thickening of the ascending colon with adjacent lobular fat stranding, compatible with known primary colonic neoplasm. 2. Enlarged ileocolic lymph nodes measure up to 11 mm in short axis, compatible for nodal disease involvement. 3. Prominent lymph nodes along the mesenteric root and retroperitoneum are nonspecific and may be reactive or reflect nodal disease involvement. 4. No evidence of metastatic disease in the chest. 5. Stable 11 mm ground-glass pulmonary nodule in the right upper lobe, suggest continued attention on follow-up imaging.   12/05/2023 Tumor Marker   Patient's tumor was tested for the following markers: CEA. Results of the tumor marker test revealed 9.3.   12/16/2023 Procedure   Right hemicolectomy   12/16/2023 Pathology Results   FINAL MICROSCOPIC DIAGNOSIS:  A. COLON, ASCENDING, PROXIMAL, TRANSVERSE, COLECTOMY: - Invasive poorly differentiated adenocarcinoma, 14 cm, circumferentially involving ascending colon and ileocecal valve - Carcinoma invades into subserosa of the adherent abdominal wall - Tumor is adherent to transverse colon but does not show definite evidence of invasion into the transverse colon wall - Resection margins are negative for carcinoma - Negative for lymphovascular or perineural invasion - Twenty-eight benign lymph nodes, negative for carcinoma (0/28) - Benign unremarkable appendix; - See oncology table  ONCOLOGY TABLE:    COLON AND RECTUM, CARCINOMA:  Resection, Including Transanal Disk Excision of Rectal Neoplasms  Procedure: Resection, ascending, proximal and transverse colon Tumor Site: Ascending colon Tumor Size: 14 cm Macroscopic Tumor Perforation: Not  identified Histologic Type: Adenocarcinoma Histologic Grade: G3: Poorly differentiated Multiple Primary Sites: Not applicable Tumor Extension: Tumor invades into subserosa of adherent abdominal wall Lymphovascular Invasion: Not identified Perineural Invasion: Not identified Treatment Effect: No known presurgical therapy Margins:      Margin Status for Invasive Carcinoma: All margins negative for invasive carcinoma      Margin Status for Non-Invasive Tumor: All margins negative for high-grade dysplasia / intramucosal carcinoma and low-grade dysplasia  Regional Lymph Nodes:      Number of Lymph Nodes with Tumor: 0      Number of Lymph Nodes Examined: 28 Tumor Deposits: Not identified  Pathologic Stage Classification (pTNM, AJCC 8th Edition): pT4b, pN0 Ancillary Studies: Previous biopsy from the tumor showed loss of expression  of MMR-related proteins MLH1 and PMS2    12/16/2023 Cancer Staging   Staging form: Colon and Rectum, AJCC 8th Edition - Clinical stage from 12/16/2023: Stage IIC (cT4b, cN0, cM0) - Signed by Eduardo Grade, MD on 01/06/2024 Histopathologic type: Adenocarcinoma, NOS Stage prefix: Initial diagnosis Total positive nodes: 0 Total nodes examined: 28 Histologic grade (G): G3 Histologic grading system: 4 grade system Laterality: Right Lymph-vascular invasion (LVI): LVI not present (absent)/not identified Tumor deposits (TD): Absent Carcinoembryonic antigen (CEA) (ng/mL): 9.3 Perineural invasion (PNI): Absent Microsatellite instability (MSI): Unstable high KRAS mutation: Not assessed NRAS mutation: Not assessed BRAF mutation: Unknown       Current Treatment: Observation  INTERVAL HISTORY:  Kayla [name] is here today for follow up. She has a history of anemia, which has shown improvement. Her iron  levels are now within normal range.  She did feel significant improvement in her fatigue. She feels good overall, with no significant tiredness or fatigue. She  recently traveled to [place]  with her children and managed well with frequent stops. She is gradually reintroducing different foods into her diet but prefers to do so at home to avoid any adverse reactions while traveling.  She experiences some pain with certain movements but does not take medication for it. She acknowledges that healing takes longer at her age but feels she is doing well. She has been able to plant a few flowers while sitting in a chair, although not as extensively as she used to.  She expressed her desire to not take chemotherapy even if the Signatera test is positive.   I have reviewed the past medical history, past surgical history, social history and family history with the patient and they are unchanged from previous note.  ALLERGIES:  has no known allergies.  MEDICATIONS:  Current Outpatient Medications  Medication Sig Dispense Refill   aspirin EC 81 MG tablet Take 81 mg by mouth daily.     famotidine  (PEPCID ) 20 MG tablet TAKE 1 TABLET BY MOUTH TWICE  DAILY (Patient taking differently: Take 20 mg by mouth in the morning.) 180 tablet 3   Fish Oil-Cholecalciferol (FISH OIL + D3) 1000-1000 MG-UNIT CAPS Take 2 capsules by mouth daily.     Iron , Ferrous Sulfate , 325 (65 Fe) MG TABS Take 325 mg by mouth daily. (Patient taking differently: Take 325 mg by mouth every other day.) 30 tablet 3   Multiple Vitamin (MULTIVITAMIN WITH MINERALS) TABS tablet Take 1 tablet by mouth daily.     valsartan  (DIOVAN ) 160 MG tablet Take 1 tablet (160 mg total) by mouth daily. 90 tablet 3   No current facility-administered medications for this visit.    REVIEW OF SYSTEMS:   Constitutional: Denies fevers, chills or abnormal weight loss Eyes: Denies blurriness of vision Ears, nose, mouth, throat, and face: Denies mucositis or sore throat Respiratory: Denies cough, dyspnea or wheezes Cardiovascular: Denies palpitation, chest discomfort or lower extremity swelling Gastrointestinal:   Denies nausea, heartburn or change in bowel habits Skin: Denies abnormal skin rashes Lymphatics: Denies new lymphadenopathy or easy bruising Neurological:Denies numbness, tingling or new weaknesses Behavioral/Psych: Mood is stable, no new changes  All other systems were reviewed with the patient and are negative.   VITALS:  Blood pressure 126/66, pulse 61, temperature 98.3 F (36.8 C), temperature source Oral, resp. rate 18, weight 153 lb 4.8 oz (69.5 kg), SpO2 97%.  Wt Readings from Last 3 Encounters:  02/03/24 153 lb 4.8 oz (69.5 kg)  01/06/24 152 lb 3.2 oz (69 kg)  12/18/23 156 lb 12 oz (71.1 kg)    Body mass index is 22.64 kg/m.  Performance status (ECOG): 0 - Asymptomatic  PHYSICAL EXAM:   GENERAL:alert, no distress and comfortable SKIN: skin color, texture, turgor are normal, no rashes or significant lesions LUNGS: clear to auscultation and percussion with normal breathing effort HEART: regular rate & rhythm and no murmurs and no lower extremity edema ABDOMEN:abdomen soft, non-tender and normal bowel sounds, surgical site clean. Musculoskeletal:no cyanosis of digits and no clubbing  NEURO: alert & oriented x 3 with fluent speech  LABORATORY DATA:  I have reviewed the data as listed    Component Value Date/Time   NA 137 01/27/2024 0917   NA 140 10/31/2023 0942   K 3.9 01/27/2024 0917   CL 103 01/27/2024 0917   CO2 26 01/27/2024 0917   GLUCOSE 90 01/27/2024 0917   BUN 26 (H) 01/27/2024 0917   BUN 18 10/31/2023 0942   CREATININE 0.72 01/27/2024 0917   CALCIUM 9.1 01/27/2024 0917   PROT 6.6 01/27/2024 0917   PROT 6.0 10/31/2023 0942   ALBUMIN 3.6 01/27/2024 0917   ALBUMIN 3.7 10/31/2023 0942   AST 21 01/27/2024 0917   ALT 17 01/27/2024 0917   ALKPHOS 79 01/27/2024 0917   BILITOT 0.2 01/27/2024 0917   BILITOT <0.2 10/31/2023 0942   GFRNONAA >60 01/27/2024 0917   GFRAA >60 08/02/2019 0309    Lab Results  Component Value Date   WBC 6.7 01/27/2024    NEUTROABS 4.1 01/27/2024   HGB 11.2 (L) 01/27/2024   HCT 35.3 (L) 01/27/2024   MCV 88.0 01/27/2024   PLT 208 01/27/2024      Chemistry      Component Value Date/Time   NA 137 01/27/2024 0917   NA 140 10/31/2023 0942   K 3.9 01/27/2024 0917   CL 103 01/27/2024 0917   CO2 26 01/27/2024 0917   BUN 26 (H) 01/27/2024 0917   BUN 18 10/31/2023 0942   CREATININE 0.72 01/27/2024 0917      Component Value Date/Time   CALCIUM 9.1 01/27/2024 0917   ALKPHOS 79 01/27/2024 0917   AST 21 01/27/2024 0917   ALT 17 01/27/2024 0917   BILITOT 0.2 01/27/2024 0917   BILITOT <0.2 10/31/2023 0942      Latest Reference Range & Units 12/05/23 08:58 01/27/24 09:17  CEA 0.0 - 4.7 ng/mL 9.3 (H) 2.3  (H): Data is abnormally high   Latest Reference Range & Units 01/27/24 09:17  Iron  28 - 170 ug/dL 75  UIBC ug/dL 161  TIBC 096 - 045 ug/dL 409  Saturation Ratios 10.4 - 31.8 % 21  Ferritin 11 - 307 ng/mL 300  Folate >5.9 ng/mL 29.0  Vitamin B12 180 - 914 pg/mL 548

## 2024-02-03 NOTE — Assessment & Plan Note (Signed)
 Iron  deficiency anemia likely secondary to GI bleed.   S/p 2 doses of IV Monoferric . Improvement in iron  panel and hemoglobin.  - Continue iron  pills every other day.  Take MiraLAX  for constipation  Recheck labs in 3 months

## 2024-02-03 NOTE — Patient Instructions (Signed)
 VISIT SUMMARY:  Today, you came in for a follow-up on your anemia and to monitor your stage two cancer. We discussed your recent blood test to check for circulating cancer cells and confirmed that your cancer is not hereditary. You mentioned feeling good overall, with no significant tiredness or fatigue, and shared that you recently traveled and managed well. You are gradually reintroducing different foods into your diet and have been able to engage in some gardening activities.  YOUR PLAN:  -STAGE 2 CANCER: Stage 2 cancer means the cancer is larger and may have spread to nearby tissues but not to distant parts of the body. We are waiting for the results of your circulating tumor cell test. Given your age and preference for quality of life, we have agreed not to start chemotherapy regardless of the test results.  -SLIGHT ANEMIA: Anemia is a condition where you don't have enough healthy red blood cells to carry adequate oxygen to your body's tissues. Your hemoglobin levels have improved, and your iron  levels are now normal. This slight anemia is likely related to your recent cancer diagnosis and treatment. We will monitor your iron  levels again in three months.  INSTRUCTIONS:  Please await the results of your circulating tumor cell test (Signatera). We will monitor your iron  levels again in three months.

## 2024-02-03 NOTE — Assessment & Plan Note (Addendum)
 Stage IIc right-sided colon cancer s/p hemicolectomy.  Low risk. MLH1, PMS2: Loss of nuclear expression.  MSI-H. BRAF: positive No positive margins, no lymph node involvement. CT CAP: No sites of metastasis CT DNA: Pending.  Patient does not want chemotherapy even if this is positive  - Will proceed with observation as per patient's preference - Surveillance as per NCCN guidelines: -H&E every 3 to 6 months for 2 years, then every 6 months for a total of 5 years -CEA every 3 to 6 months for 2 years, then every 6 months for a total of 5 years -CT CAP every 6 to 12 months from the date of surgery for a total of 5 years -Colonoscopy in 1 year after surgery  - CEA from 01/27/2024: Normal 2.7 - CT CAP due in 05/2024  Return to clinic in 3 months with labs

## 2024-03-01 ENCOUNTER — Ambulatory Visit: Admitting: Internal Medicine

## 2024-03-02 ENCOUNTER — Encounter (HOSPITAL_COMMUNITY): Payer: Self-pay | Admitting: Oncology

## 2024-03-02 LAB — SIGNATERA
SIGNATERA MTM READOUT: 0 MTM/ml
SIGNATERA TEST RESULT: NEGATIVE

## 2024-03-06 ENCOUNTER — Ambulatory Visit (INDEPENDENT_AMBULATORY_CARE_PROVIDER_SITE_OTHER): Admitting: Internal Medicine

## 2024-03-06 ENCOUNTER — Encounter: Payer: Self-pay | Admitting: Internal Medicine

## 2024-03-06 VITALS — BP 127/64 | HR 71 | Ht 69.0 in | Wt 162.2 lb

## 2024-03-06 DIAGNOSIS — C182 Malignant neoplasm of ascending colon: Secondary | ICD-10-CM

## 2024-03-06 DIAGNOSIS — D500 Iron deficiency anemia secondary to blood loss (chronic): Secondary | ICD-10-CM | POA: Diagnosis not present

## 2024-03-06 DIAGNOSIS — I10 Essential (primary) hypertension: Secondary | ICD-10-CM | POA: Diagnosis not present

## 2024-03-06 DIAGNOSIS — N1831 Chronic kidney disease, stage 3a: Secondary | ICD-10-CM

## 2024-03-06 NOTE — Patient Instructions (Signed)
It was a pleasure to see you today.  Thank you for giving [place] the opportunity to be involved in your care.  Below is a brief recap of your visit and next steps.  We will plan to see you again in 6 months  Summary No medication changes today Follow up in 6 months

## 2024-03-06 NOTE — Assessment & Plan Note (Signed)
 Remains adequately controlled with valsartan .

## 2024-03-06 NOTE — Assessment & Plan Note (Signed)
 Renal function stable on labs from last month.  Currently on ARB.

## 2024-03-06 NOTE — Assessment & Plan Note (Signed)
 Adenocarcinoma of the ascending colon diagnosed in late February.  S/p partial colectomy on 3/21.  Margins negative, no lymphovascular or perineural invasion.  Her postoperative course has been uncomplicated.  Last seen by oncology for follow-up 5/9.  Signatera negative.  She is currently under surveillance.

## 2024-03-06 NOTE — Assessment & Plan Note (Signed)
 Hgb improving.  S/p IV Monoferric  x 2.  Currently prescribed oral iron  supplementation.  Oncology follow-up and repeat labs are scheduled for August.

## 2024-03-06 NOTE — Progress Notes (Signed)
 Established Patient Office Visit  Subjective   Patient ID: Kayla [name], female    DOB: 1934/06/15  Age: 89 y.o. MRN: [id number]  Chief Complaint  Patient presents with   Hypertension    Three month follow up    [name] returns to care today for routine follow-up.  She was last evaluated by me on 3/5 for her annual physical.  No medication changes were made at that time and 6-month follow-up was arranged for reassessment.  In the interim, she underwent partial colectomy in the setting of adenocarcinoma of the ascending colon on 3/21.  Her postoperative course has been uncomplicated.  She has been seen by oncology for follow-up on multiple occasions since hospital discharge, 5/9 most recently.  There have otherwise been no acute interval events.  Today she reports feeling well and has no acute concerns to discuss.  Past Medical History:  Diagnosis Date   Allergy    Anemia    gets iron  infusions.   Arthritis    Cancer (HCC)    colon cancer,  skin cancer on left cheek removed   Cataract    GERD (gastroesophageal reflux disease)    Heart murmur    Hypertension    Skin cancer    Past Surgical History:  Procedure Laterality Date   BELPHAROPTOSIS REPAIR Bilateral    BIOPSY  11/21/2023   Procedure: BIOPSY;  Surgeon: Hargis Lias, MD;  Location: [facility];  Service: Endoscopy;;   COLONOSCOPY WITH PROPOFOL  N/A 11/21/2023   Procedure: COLONOSCOPY WITH PROPOFOL ;  Surgeon: Hargis Lias, MD;  Location: [facility];  Service: Endoscopy;  Laterality: N/A;  12:45 pm, asa 2   ESOPHAGOGASTRODUODENOSCOPY (EGD) WITH PROPOFOL  N/A 11/21/2023   Procedure: ESOPHAGOGASTRODUODENOSCOPY (EGD) WITH PROPOFOL ;  Surgeon: Hargis Lias, MD;  Location: [facility];  Service: Endoscopy;  Laterality: N/A;   EYE SURGERY     cataract extraction.   SUBMUCOSAL TATTOO INJECTION  11/21/2023   Procedure: SUBMUCOSAL TATTOO INJECTION;  Surgeon: Hargis Lias, MD;  Location: [facility];  Service: Endoscopy;;   TONSILLECTOMY     age 33   Social History   Tobacco Use   Smoking status: Never   Smokeless tobacco: Never  Vaping Use   Vaping status: Never Used  Substance Use Topics   Alcohol  use: Never   Drug use: Never   No family history on file. No Known Allergies  Review of Systems  Constitutional:  Negative for chills and fever.  HENT:  Negative for sore throat.   Respiratory:  Negative for cough and shortness of breath.   Cardiovascular:  Negative for chest pain, palpitations and leg swelling.  Gastrointestinal:  Negative for abdominal pain, blood in stool, constipation, diarrhea, nausea and vomiting.  Genitourinary:  Negative for dysuria and hematuria.  Musculoskeletal:  Negative for myalgias.  Skin:  Negative for itching and rash.  Neurological:  Negative for dizziness and headaches.  Psychiatric/Behavioral:  Negative for depression and suicidal ideas.       Objective:     BP 127/64   Pulse 71   Ht 5\' 9"  (1.753 m)   Wt 162 lb 3.2 oz (73.6 kg)   SpO2 98%   BMI 23.95 kg/m  BP Readings from Last 3 Encounters:  03/06/24 127/64  02/03/24 126/66  01/10/24 (!) 113/44   Physical Exam Vitals reviewed.  Constitutional:      General: She is not in acute distress.    Appearance: Normal appearance. She  is not toxic-appearing.  HENT:     Head: Normocephalic and atraumatic.     Right Ear: External ear normal.     Left Ear: External ear normal.     Nose: Nose normal. No congestion or rhinorrhea.     Mouth/Throat:     Mouth: Mucous membranes are moist.     Pharynx: Oropharynx is clear. No oropharyngeal exudate or posterior oropharyngeal erythema.  Eyes:     General: No scleral icterus.    Extraocular Movements: Extraocular movements intact.     Conjunctiva/sclera: Conjunctivae normal.     Pupils: Pupils are equal, round, and reactive to light.  Cardiovascular:     Rate and Rhythm: Normal rate and regular rhythm.     Pulses: Normal pulses.      Heart sounds: Normal heart sounds. No murmur heard.    No friction rub. No gallop.  Pulmonary:     Effort: Pulmonary effort is normal.     Breath sounds: Normal breath sounds. No wheezing, rhonchi or rales.  Abdominal:     General: Abdomen is flat. Bowel sounds are normal. There is no distension.     Palpations: Abdomen is soft.     Tenderness: There is no abdominal tenderness.  Musculoskeletal:        General: No swelling. Normal range of motion.     Cervical back: Normal range of motion.     Right lower leg: No edema.     Left lower leg: No edema.  Lymphadenopathy:     Cervical: No cervical adenopathy.  Skin:    General: Skin is warm and dry.     Capillary Refill: Capillary refill takes less than 2 seconds.     Coloration: Skin is not jaundiced.  Neurological:     General: No focal deficit present.     Mental Status: She is alert and oriented to person, place, and time.  Psychiatric:        Mood and Affect: Mood normal.        Behavior: Behavior normal.   Last CBC Lab Results  Component Value Date   WBC 6.7 01/27/2024   HGB 11.2 (L) 01/27/2024   HCT 35.3 (L) 01/27/2024   MCV 88.0 01/27/2024   MCH 27.9 01/27/2024   RDW 19.7 (H) 01/27/2024   PLT 208 01/27/2024   Last metabolic panel Lab Results  Component Value Date   GLUCOSE 90 01/27/2024   NA 137 01/27/2024   K 3.9 01/27/2024   CL 103 01/27/2024   CO2 26 01/27/2024   BUN 26 (H) 01/27/2024   CREATININE 0.72 01/27/2024   GFRNONAA >60 01/27/2024   CALCIUM 9.1 01/27/2024   PROT 6.6 01/27/2024   ALBUMIN 3.6 01/27/2024   LABGLOB 2.3 10/31/2023   AGRATIO 1.6 10/18/2022   BILITOT 0.2 01/27/2024   ALKPHOS 79 01/27/2024   AST 21 01/27/2024   ALT 17 01/27/2024   ANIONGAP 8 01/27/2024   Last lipids Lab Results  Component Value Date   CHOL 202 (H) 10/31/2023   HDL 45 10/31/2023   LDLCALC 136 (H) 10/31/2023   TRIG 114 10/31/2023   CHOLHDL 4.5 (H) 10/31/2023   Last hemoglobin A1c Lab Results  Component  Value Date   HGBA1C 5.6 10/18/2022   Last thyroid  functions Lab Results  Component Value Date   TSH 2.200 10/31/2023   Last vitamin D  Lab Results  Component Value Date   VD25OH 51.8 10/31/2023   Last vitamin B12 and Folate Lab Results  Component Value Date  VITAMINB12 548 01/27/2024   FOLATE 29.0 01/27/2024     Assessment & Plan:   Problem List Items Addressed This Visit       Hypertension - Primary   Remains adequately controlled with valsartan .      Colon cancer, ascending (HCC)   Adenocarcinoma of the ascending colon diagnosed in late February.  S/p partial colectomy on 3/21.  Margins negative, no lymphovascular or perineural invasion.  Her postoperative course has been uncomplicated.  Last seen by oncology for follow-up 5/9.  Signatera negative.  She is currently under surveillance.      CKD stage 3a, GFR 45-59 ml/min (HCC)   Renal function stable on labs from last month.  Currently on ARB.      IDA (iron  deficiency anemia)   Hgb improving.  S/p IV Monoferric  x 2.  Currently prescribed oral iron  supplementation.  Oncology follow-up and repeat labs are scheduled for August.       Return in about 6 months (around 09/05/2024).    Tobi Fortes, MD

## 2024-03-10 ENCOUNTER — Other Ambulatory Visit: Payer: Self-pay | Admitting: Internal Medicine

## 2024-03-10 DIAGNOSIS — I10 Essential (primary) hypertension: Secondary | ICD-10-CM

## 2024-04-04 ENCOUNTER — Ambulatory Visit (INDEPENDENT_AMBULATORY_CARE_PROVIDER_SITE_OTHER): Admitting: Internal Medicine

## 2024-04-04 ENCOUNTER — Encounter: Payer: Self-pay | Admitting: Internal Medicine

## 2024-04-04 VITALS — BP 140/70 | HR 75 | Ht 69.0 in | Wt 159.0 lb

## 2024-04-04 DIAGNOSIS — K1121 Acute sialoadenitis: Secondary | ICD-10-CM | POA: Insufficient documentation

## 2024-04-04 MED ORDER — AMOXICILLIN-POT CLAVULANATE 875-125 MG PO TABS: 1.0000 | ORAL_TABLET | Freq: Two times a day (BID) | ORAL | 0 refills | Status: DC

## 2024-04-04 NOTE — Progress Notes (Unsigned)
 Acute Office Visit  Subjective:    Patient ID: Kayla [name], female    DOB: 1934/01/21, 89 y.o.   MRN: [id number]  Chief Complaint  Patient presents with   Neck Pain    Pt reports sx of salivary gland swelling. Has taken antibiotic , swelling is better.     HPI Patient is in today for evaluation of a submandibular bump for the last 3 weeks.  She was evaluated by her dental surgeon, who prescribed amoxicillin  about a week ago, which she has completed.  She has noticed significant improvement in the size of the bump, but was still advised to see primary care by the dentist.  She still has submandibular gland enlargement and dry mouth, but denies pain in the neck area currently.  Denies any fever or chills.  Past Medical History:  Diagnosis Date   Allergy    Anemia    gets iron  infusions.   Arthritis    Cancer (HCC)    colon cancer,  skin cancer on left cheek removed   Cataract    GERD (gastroesophageal reflux disease)    Heart murmur    Hypertension    Skin cancer     Past Surgical History:  Procedure Laterality Date   BELPHAROPTOSIS REPAIR Bilateral    BIOPSY  11/21/2023   Procedure: BIOPSY;  Surgeon: Cinderella Deatrice FALCON, MD;  [facility]: [facility];  Service: Endoscopy;;   COLONOSCOPY WITH PROPOFOL  N/A 11/21/2023   Procedure: COLONOSCOPY WITH PROPOFOL ;  Surgeon: Cinderella Deatrice FALCON, MD;  Location: [facility];  Service: Endoscopy;  Laterality: N/A;  12:45 pm, asa 2   ESOPHAGOGASTRODUODENOSCOPY (EGD) WITH PROPOFOL  N/A 11/21/2023   Procedure: ESOPHAGOGASTRODUODENOSCOPY (EGD) WITH PROPOFOL ;  Surgeon: Cinderella Deatrice FALCON, MD;  Location: [facility];  Service: Endoscopy;  Laterality: N/A;   EYE SURGERY     cataract extraction.   SUBMUCOSAL TATTOO INJECTION  11/21/2023   Procedure: SUBMUCOSAL TATTOO INJECTION;  Surgeon: Cinderella Deatrice FALCON, MD;  Location: [facility];  Service: Endoscopy;;   TONSILLECTOMY     age 44    History reviewed. No pertinent family  history.  Social History   Socioeconomic History   Marital status: Widowed    Spouse name: Not on file   Number of children: Not on file   Years of education: Not on file   Highest education level: 12th grade  Occupational History   Not on file  Tobacco Use   Smoking status: Never   Smokeless tobacco: Never  Vaping Use   Vaping status: Never Used  Substance and Sexual Activity   Alcohol  use: Never   Drug use: Never   Sexual activity: Not Currently    Birth control/protection: None  Other Topics Concern   Not on file  Social History Narrative   Not on file   Social Drivers of Health   Financial Resource Strain: Low Risk  (04/03/2024)   Overall Financial Resource Strain (CARDIA)    Difficulty of Paying Living Expenses: Not hard at all  Food Insecurity: No Food Insecurity (04/03/2024)   Hunger Vital Sign    Worried About Running Out of Food in the Last Year: Never true    Ran Out of Food in the Last Year: Never true  Transportation Needs: No Transportation Needs (04/03/2024)   PRAPARE - Administrator, Civil Service (Medical): No    Lack of Transportation (Non-Medical): No  Physical Activity: Insufficiently Active (04/03/2024)   Exercise Vital Sign  Days of Exercise per Week: 4 days    Minutes of Exercise per Session: 30 min  Stress: No Stress Concern Present (04/03/2024)   [employer] of Occupational Health - Occupational Stress Questionnaire    Feeling of Stress: Not at all  Social Connections: Moderately Integrated (04/03/2024)   Social Connection and Isolation Panel    Frequency of Communication with Friends and Family: More than three times a week    Frequency of Social Gatherings with Friends and Family: More than three times a week    Attends Religious Services: More than 4 times per year    Active Member of [employer] or Organizations: Yes    Attends Banker Meetings: More than 4 times per year    Marital Status: Widowed  Intimate Partner  Violence: Not At Risk (12/18/2023)   Humiliation, Afraid, Rape, and Kick questionnaire    Fear of Current or Ex-Partner: No    Emotionally Abused: No    Physically Abused: No    Sexually Abused: No    Outpatient Medications Prior to Visit  Medication Sig Dispense Refill   aspirin EC 81 MG tablet Take 81 mg by mouth daily.     famotidine  (PEPCID ) 20 MG tablet TAKE 1 TABLET BY MOUTH TWICE  DAILY (Patient taking differently: Take 20 mg by mouth in the morning.) 180 tablet 3   Fish Oil-Cholecalciferol (FISH OIL + D3) 1000-1000 MG-UNIT CAPS Take 2 capsules by mouth daily.     Iron , Ferrous Sulfate , 325 (65 Fe) MG TABS Take 325 mg by mouth daily. (Patient taking differently: Take 325 mg by mouth every other day.) 30 tablet 3   Multiple Vitamin (MULTIVITAMIN WITH MINERALS) TABS tablet Take 1 tablet by mouth daily.     valsartan  (DIOVAN ) 160 MG tablet TAKE 1 TABLET BY MOUTH DAILY 90 tablet 3   No facility-administered medications prior to visit.    No Known Allergies  Review of Systems  Constitutional:  Negative for chills and fever.  HENT:  Negative for congestion and sore throat.   Respiratory:  Negative for cough and shortness of breath.   Cardiovascular:  Negative for chest pain and palpitations.  Gastrointestinal:  Negative for nausea and vomiting.  Musculoskeletal:  Negative for neck pain and neck stiffness.       Right foot pain and swelling  Skin:  Negative for rash.  Neurological:  Negative for dizziness and weakness.  Psychiatric/Behavioral:  Negative for agitation and behavioral problems.        Objective:    Physical Exam Vitals reviewed.  Constitutional:      General: She is not in acute distress.    Appearance: She is not diaphoretic.  HENT:     Head: Normocephalic and atraumatic.     Mouth/Throat:     Mouth: Mucous membranes are dry.     Pharynx: No posterior oropharyngeal erythema.  Eyes:     General: No scleral icterus.    Extraocular Movements: Extraocular  movements intact.  Neck:     Comments: Left sided submandibular mass, about 3 cm in diameter, nontender Cardiovascular:     Rate and Rhythm: Normal rate and regular rhythm.     Heart sounds: Murmur (Systolic) heard.  Pulmonary:     Breath sounds: Normal breath sounds. No wheezing or rales.  Musculoskeletal:     Cervical back: Neck supple. No tenderness.     Right lower leg: Edema present.     Left lower leg: No edema.  Right ankle: Swelling present. Tenderness present. Decreased range of motion.     Right foot: Decreased range of motion.  Skin:    General: Skin is warm.     Findings: No rash.  Neurological:     General: No focal deficit present.     Mental Status: She is alert and oriented to person, place, and time.  Psychiatric:        Mood and Affect: Mood normal.        Behavior: Behavior normal.     BP (!) 140/70 (BP Location: Left Arm)   Pulse 75   Ht 5' 9 (1.753 m)   Wt 159 lb (72.1 kg)   SpO2 98%   BMI 23.48 kg/m  Wt Readings from Last 3 Encounters:  04/04/24 159 lb (72.1 kg)  03/06/24 162 lb 3.2 oz (73.6 kg)  02/03/24 153 lb 4.8 oz (69.5 kg)        Assessment & Plan:   Problem List Items Addressed This Visit       Digestive   Acute sialoadenitis - Primary   Started Augmentin  for anaerobic coverage Advised to perform warm compresses Massage/milking of the gland advised Advised to take sips of water to prevent dry mouth If persistent after 2 weeks, will refer to ENT specialist      Relevant Medications   amoxicillin -clavulanate (AUGMENTIN ) 875-125 MG tablet     Meds ordered this encounter  Medications   amoxicillin -clavulanate (AUGMENTIN ) 875-125 MG tablet    Sig: Take 1 tablet by mouth 2 (two) times daily.    Dispense:  10 tablet    Refill:  0     Ritu Gagliardo MARLA Blanch, MD

## 2024-04-04 NOTE — Patient Instructions (Addendum)
 You are advised to stay well hydrated, apply moist heat to the involved area, massage the gland, and milk the duct.

## 2024-04-05 NOTE — Assessment & Plan Note (Signed)
 Started Augmentin  for anaerobic coverage Advised to perform warm compresses Massage/milking of the gland advised Advised to take sips of water to prevent dry mouth If persistent after 2 weeks, will refer to ENT specialist

## 2024-05-10 DIAGNOSIS — R911 Solitary pulmonary nodule: Secondary | ICD-10-CM | POA: Diagnosis not present

## 2024-05-10 DIAGNOSIS — I700 Atherosclerosis of aorta: Secondary | ICD-10-CM | POA: Diagnosis not present

## 2024-05-10 DIAGNOSIS — R918 Other nonspecific abnormal finding of lung field: Secondary | ICD-10-CM | POA: Diagnosis not present

## 2024-05-11 ENCOUNTER — Inpatient Hospital Stay: Attending: Oncology

## 2024-05-11 DIAGNOSIS — D500 Iron deficiency anemia secondary to blood loss (chronic): Secondary | ICD-10-CM

## 2024-05-11 DIAGNOSIS — C182 Malignant neoplasm of ascending colon: Secondary | ICD-10-CM | POA: Diagnosis not present

## 2024-05-11 DIAGNOSIS — Z85038 Personal history of other malignant neoplasm of large intestine: Secondary | ICD-10-CM | POA: Diagnosis not present

## 2024-05-11 LAB — CBC WITH DIFFERENTIAL/PLATELET
Abs Immature Granulocytes: 0.03 K/uL (ref 0.00–0.07)
Basophils Absolute: 0 K/uL (ref 0.0–0.1)
Basophils Relative: 1 %
Eosinophils Absolute: 0.4 K/uL (ref 0.0–0.5)
Eosinophils Relative: 5 %
HCT: 36.6 % (ref 36.0–46.0)
Hemoglobin: 12.4 g/dL (ref 12.0–15.0)
Immature Granulocytes: 0 %
Lymphocytes Relative: 26 %
Lymphs Abs: 1.9 K/uL (ref 0.7–4.0)
MCH: 31.2 pg (ref 26.0–34.0)
MCHC: 33.9 g/dL (ref 30.0–36.0)
MCV: 92 fL (ref 80.0–100.0)
Monocytes Absolute: 0.5 K/uL (ref 0.1–1.0)
Monocytes Relative: 7 %
Neutro Abs: 4.5 K/uL (ref 1.7–7.7)
Neutrophils Relative %: 61 %
Platelets: 251 K/uL (ref 150–400)
RBC: 3.98 MIL/uL (ref 3.87–5.11)
RDW: 12.2 % (ref 11.5–15.5)
WBC: 7.4 K/uL (ref 4.0–10.5)
nRBC: 0 % (ref 0.0–0.2)

## 2024-05-11 LAB — COMPREHENSIVE METABOLIC PANEL WITH GFR
ALT: 16 U/L (ref 0–44)
AST: 20 U/L (ref 15–41)
Albumin: 3.7 g/dL (ref 3.5–5.0)
Alkaline Phosphatase: 67 U/L (ref 38–126)
Anion gap: 9 (ref 5–15)
BUN: 22 mg/dL (ref 8–23)
CO2: 26 mmol/L (ref 22–32)
Calcium: 9.3 mg/dL (ref 8.9–10.3)
Chloride: 99 mmol/L (ref 98–111)
Creatinine, Ser: 0.87 mg/dL (ref 0.44–1.00)
GFR, Estimated: >60 mL/min (ref >60)
Glucose, Bld: 126 mg/dL — ABNORMAL HIGH (ref 70–99)
Potassium: 4.2 mmol/L (ref 3.5–5.1)
Sodium: 134 mmol/L — ABNORMAL LOW (ref 135–145)
Total Bilirubin: 0.4 mg/dL (ref 0.0–1.2)
Total Protein: 6.8 g/dL (ref 6.5–8.1)

## 2024-05-11 LAB — FOLATE: Folate: 46.4 ng/mL (ref >5.9)

## 2024-05-11 LAB — IRON AND TIBC
Iron: 112 ug/dL (ref 28–170)
Saturation Ratios: 30 % (ref 10.4–31.8)
TIBC: 368 ug/dL (ref 250–450)
UIBC: 256 ug/dL

## 2024-05-11 LAB — FERRITIN: Ferritin: 136 ng/mL (ref 11–307)

## 2024-05-11 LAB — VITAMIN B12: Vitamin B-12: 494 pg/mL (ref 180–914)

## 2024-05-12 LAB — CEA: CEA: 2.5 ng/mL (ref 0.0–4.7)

## 2024-05-17 NOTE — Assessment & Plan Note (Addendum)
 Stage IIc right-sided colon cancer s/p hemicolectomy.  Low risk. MLH1, PMS2: Loss of nuclear expression.  MSI-H. BRAF: positive No positive margins, no lymph node involvement. CT CAP: No sites of metastasis CT DNA: 03/02/24: Negative. 05/11/24: Pending  - Will proceed with surveillance as per patient's preference - Surveillance as per NCCN guidelines: -H&E every 3 to 6 months for 2 years, then every 6 months for a total of 5 years -CEA every 3 to 6 months for 2 years, then every 6 months for a total of 5 years -CT CAP every 6 to 12 months from the date of surgery for a total of 5 years -Colonoscopy in 1 year after surgery -Labs reviewed today.  CMP, CBC, CEA, iron  panel, folate, B12: Grossly normal except for mild hyponatremia - CEA from 05/11/2024: Normal 2.5.  Repeat every 3 months.  Due November 2025 - Patient wishes to do CT scan every 12 months.  Due 11/2024  Return to clinic in 3 months with labs

## 2024-05-17 NOTE — Progress Notes (Addendum)
 Patient Care Team: [name], MD as PCP - General (Internal Medicine) [name], MD as Medical Oncologist (Medical Oncology) Celestia Joesph SQUIBB, RN as Oncology Nurse Navigator (Medical Oncology)  Clinic Day:  05/19/2024  Referring physician: Melvenia Manus BRAVO, MD   CHIEF COMPLAINT:  CC: Stage IIc colon carcinoma   ASSESSMENT & PLAN:   Assessment & Plan: Kayla [name]  is a 89 y.o. female with stage IIc colon carcinoma  Assessment & Plan Malignant neoplasm of ascending colon (HCC) Stage IIc right-sided colon cancer s/p hemicolectomy.  Low risk. MLH1, PMS2: Loss of nuclear expression.  MSI-H. BRAF: positive No positive margins, no lymph node involvement. CT CAP: No sites of metastasis CT DNA: 03/02/24: Negative. 05/11/24: Pending  - Will proceed with surveillance as per patient's preference - Surveillance as per NCCN guidelines: -H&E every 3 to 6 months for 2 years, then every 6 months for a total of 5 years -CEA every 3 to 6 months for 2 years, then every 6 months for a total of 5 years -CT CAP every 6 to 12 months from the date of surgery for a total of 5 years -Colonoscopy in 1 year after surgery -Labs reviewed today.  CMP, CBC, CEA, iron  panel, folate, B12: Grossly normal except for mild hyponatremia - CEA from 05/11/2024: Normal 2.5.  Repeat every 3 months.  Due November 2025 - Patient wishes to do CT scan every 12 months.  Due 11/2024  Return to clinic in 3 months with labs Iron  deficiency anemia due to chronic blood loss Likely secondary to colon carcinoma S/p IV iron  Anemia resolved with iron  replacement  -Can discontinue oral iron  at this time - Continue to monitor  The patient understands the plans discussed today and is in agreement with them.  She knows to contact our office if she develops concerns prior to her next appointment.  I provided 20 minutes of face-to-face time during this encounter and > 50% was spent counseling as documented under  my assessment and plan.    [name], MD  [facility] [place] - A DEPT OF [facility] [address] [place] [address] Dept: [phone] Dept Fax: [phone]     Orders Placed This Encounter  Procedures   CBC with Differential/Platelet    Standing Status:   Future    Expected Date:   08/31/2024    Expiration Date:   11/29/2024   Comprehensive metabolic panel with GFR    Standing Status:   Future    Expected Date:   08/31/2024    Expiration Date:   11/29/2024   CEA    Standing Status:   Future    Expected Date:   08/31/2024    Expiration Date:   11/29/2024     ONCOLOGY HISTORY:   Oncology History  Malignant neoplasm of ascending colon (HCC)  11/11/2023 Imaging   CT A&P wo contrast:  Large irregular mass lesion in the ascending colon as described consistent with colonic neoplasm till proven otherwise.    11/21/2023 Initial Diagnosis   Malignant neoplasm of ascending colon (HCC)   11/21/2023 Procedure   Colonoscopy:  Malignant partially obstructing tumor in the entire length of ascending colon. Note cecum was visualized from far endoscopically and was not intubated given friability and mass compressing against the cecum hence could be involving the cecum.  - One 2 mm polyp in the sigmoid colon, removed with a cold biopsy forceps. Resected and  retrieved.   11/21/2023 Pathology Results   COLON, ASCENDING MASS, BIOPSY:  Poorly differentiated adenocarcinoma with signet ring cells.   COLON, SIGMOID, POLYPECTOMY: Hyperplastic polyp.   IHC EXPRESSION RESULTS  TEST           RESULT MLH1:          LOSS OF NUCLEAR EXPRESSION MSH2:          Preserved nuclear expression MSH6:          Preserved nuclear expression PMS2:          LOSS OF NUCLEAR EXPRESSION   BRAF V600E: Mutation detected in BRAF Exon 15; p.V600E   11/29/2023 Imaging   CT CAP w contrast:  1. Short segment irregular wall thickening of the ascending  colon with adjacent lobular fat stranding, compatible with known primary colonic neoplasm. 2. Enlarged ileocolic lymph nodes measure up to 11 mm in short axis, compatible for nodal disease involvement. 3. Prominent lymph nodes along the mesenteric root and retroperitoneum are nonspecific and may be reactive or reflect nodal disease involvement. 4. No evidence of metastatic disease in the chest. 5. Stable 11 mm ground-glass pulmonary nodule in the right upper lobe, suggest continued attention on follow-up imaging.   12/05/2023 Tumor Marker   Patient's tumor was tested for the following markers: CEA. Results of the tumor marker test revealed 9.3.   12/16/2023 Procedure   Right hemicolectomy   12/16/2023 Pathology Results   FINAL MICROSCOPIC DIAGNOSIS:  A. COLON, ASCENDING, PROXIMAL, TRANSVERSE, COLECTOMY: - Invasive poorly differentiated adenocarcinoma, 14 cm, circumferentially involving ascending colon and ileocecal valve - Carcinoma invades into subserosa of the adherent abdominal wall - Tumor is adherent to transverse colon but does not show definite evidence of invasion into the transverse colon wall - Resection margins are negative for carcinoma - Negative for lymphovascular or perineural invasion - Twenty-eight benign lymph nodes, negative for carcinoma (0/28) - Benign unremarkable appendix; - See oncology table  ONCOLOGY TABLE:    COLON AND RECTUM, CARCINOMA:  Resection, Including Transanal Disk Excision of Rectal Neoplasms  Procedure: Resection, ascending, proximal and transverse colon Tumor Site: Ascending colon Tumor Size: 14 cm Macroscopic Tumor Perforation: Not identified Histologic Type: Adenocarcinoma Histologic Grade: G3: Poorly differentiated Multiple Primary Sites: Not applicable Tumor Extension: Tumor invades into subserosa of adherent abdominal wall Lymphovascular Invasion: Not identified Perineural Invasion: Not identified Treatment Effect: No known presurgical  therapy Margins:      Margin Status for Invasive Carcinoma: All margins negative for invasive carcinoma      Margin Status for Non-Invasive Tumor: All margins negative for high-grade dysplasia / intramucosal carcinoma and low-grade dysplasia  Regional Lymph Nodes:      Number of Lymph Nodes with Tumor: 0      Number of Lymph Nodes Examined: 28 Tumor Deposits: Not identified  Pathologic Stage Classification (pTNM, AJCC 8th Edition): pT4b, pN0 Ancillary Studies: Previous biopsy from the tumor showed loss of expression of MMR-related proteins MLH1 and PMS2    12/16/2023 Cancer Staging   Staging form: Colon and Rectum, AJCC 8th Edition - Clinical stage from 12/16/2023: Stage IIC (cT4b, cN0, cM0) - Signed by [name], MD on 01/06/2024 Histopathologic type: Adenocarcinoma, NOS Stage prefix: Initial diagnosis Total positive nodes: 0 Total nodes examined: 28 Histologic grade (G): G3 Histologic grading system: 4 grade system Laterality: Right Lymph-vascular invasion (LVI): LVI not present (absent)/not identified Tumor deposits (TD): Absent Carcinoembryonic antigen (CEA) (ng/mL): 9.3 Perineural invasion (PNI): Absent Microsatellite instability (MSI): Unstable high KRAS mutation: Not assessed  NRAS mutation: Not assessed BRAF mutation: Unknown       Current Treatment: Surveillance  INTERVAL HISTORY:  Kayla [name] is here today for follow up.  She is accompanied by her daughter today.  She denies nausea vomiting, melena, hematochezia, weight loss, loss of appetite.  She reports having more energy and is using her weight.  To take out the weeds and is very active around the house.  She is still taking iron  pills every other day.  Overall, patient is doing very well.   I have reviewed the past medical history, past surgical history, social history and family history with the patient and they are unchanged from previous note.  ALLERGIES:  has no known allergies.  MEDICATIONS:   Current Outpatient Medications  Medication Sig Dispense Refill   aspirin EC 81 MG tablet Take 81 mg by mouth daily.     famotidine  (PEPCID ) 20 MG tablet TAKE 1 TABLET BY MOUTH TWICE  DAILY (Patient taking differently: Take 20 mg by mouth in the morning.) 180 tablet 3   Fish Oil-Cholecalciferol (FISH OIL + D3) 1000-1000 MG-UNIT CAPS Take 2 capsules by mouth daily.     Iron , Ferrous Sulfate , 325 (65 Fe) MG TABS Take 325 mg by mouth daily. (Patient taking differently: Take 325 mg by mouth every other day.) 30 tablet 3   Multiple Vitamin (MULTIVITAMIN WITH MINERALS) TABS tablet Take 1 tablet by mouth daily.     valsartan  (DIOVAN ) 160 MG tablet TAKE 1 TABLET BY MOUTH DAILY 90 tablet 3   No current facility-administered medications for this visit.    REVIEW OF SYSTEMS:   Constitutional: Denies fevers, chills or abnormal weight loss Eyes: Denies blurriness of vision Ears, nose, mouth, throat, and face: Denies mucositis or sore throat Respiratory: Denies cough, dyspnea or wheezes Cardiovascular: Denies palpitation, chest discomfort or lower extremity swelling Gastrointestinal:  Denies nausea, heartburn or change in bowel habits Skin: Denies abnormal skin rashes Lymphatics: Denies new lymphadenopathy or easy bruising Neurological:Denies numbness, tingling or new weaknesses Behavioral/Psych: Mood is stable, no new changes  All other systems were reviewed with the patient and are negative.   VITALS:  Blood pressure (!) 139/45, pulse 64, temperature (!) 97.5 F (36.4 C), temperature source Oral, resp. rate 18, weight 162 lb 8 oz (73.7 kg), SpO2 100%.  Wt Readings from Last 3 Encounters:  05/18/24 162 lb 8 oz (73.7 kg)  04/04/24 159 lb (72.1 kg)  03/06/24 162 lb 3.2 oz (73.6 kg)    Body mass index is 24 kg/m.  Performance status (ECOG): 0 - Asymptomatic  PHYSICAL EXAM:   GENERAL:alert, no distress and comfortable SKIN: skin color, texture, turgor are normal, no rashes or significant  lesions LUNGS: clear to auscultation and percussion with normal breathing effort HEART: regular rate & rhythm and no murmurs and no lower extremity edema ABDOMEN:abdomen soft, non-tender and normal bowel sounds, surgical site clean. Musculoskeletal:no cyanosis of digits and no clubbing  NEURO: alert & oriented x 3 with fluent speech  LABORATORY DATA:  I have reviewed the data as listed    Component Value Date/Time   NA 134 (L) 05/11/2024 1019   NA 140 10/31/2023 0942   K 4.2 05/11/2024 1019   CL 99 05/11/2024 1019   CO2 26 05/11/2024 1019   GLUCOSE 126 (H) 05/11/2024 1019   BUN 22 05/11/2024 1019   BUN 18 10/31/2023 0942   CREATININE 0.87 05/11/2024 1019   CALCIUM 9.3 05/11/2024 1019   PROT 6.8 05/11/2024 1019  PROT 6.0 10/31/2023 0942   ALBUMIN 3.7 05/11/2024 1019   ALBUMIN 3.7 10/31/2023 0942   AST 20 05/11/2024 1019   ALT 16 05/11/2024 1019   ALKPHOS 67 05/11/2024 1019   BILITOT 0.4 05/11/2024 1019   BILITOT <0.2 10/31/2023 0942   GFRNONAA >60 05/11/2024 1019   GFRAA >60 08/02/2019 0309    Lab Results  Component Value Date   WBC 7.4 05/11/2024   NEUTROABS 4.5 05/11/2024   HGB 12.4 05/11/2024   HCT 36.6 05/11/2024   MCV 92.0 05/11/2024   PLT 251 05/11/2024      Chemistry      Component Value Date/Time   NA 134 (L) 05/11/2024 1019   NA 140 10/31/2023 0942   K 4.2 05/11/2024 1019   CL 99 05/11/2024 1019   CO2 26 05/11/2024 1019   BUN 22 05/11/2024 1019   BUN 18 10/31/2023 0942   CREATININE 0.87 05/11/2024 1019      Component Value Date/Time   CALCIUM 9.3 05/11/2024 1019   ALKPHOS 67 05/11/2024 1019   AST 20 05/11/2024 1019   ALT 16 05/11/2024 1019   BILITOT 0.4 05/11/2024 1019   BILITOT <0.2 10/31/2023 0942      Latest Reference Range & Units 05/11/24 10:19  CEA 0.0 - 4.7 ng/mL 2.5     Latest Reference Range & Units 05/11/24 10:19  Iron  28 - 170 ug/dL 887  UIBC ug/dL 743  TIBC 749 - 549 ug/dL 631  Saturation Ratios 10.4 - 31.8 % 30  Ferritin  11 - 307 ng/mL 136  Folate >5.9 ng/mL 46.4  Vitamin B12 180 - 914 pg/mL 494

## 2024-05-17 NOTE — Assessment & Plan Note (Addendum)
 Likely secondary to colon carcinoma S/p IV iron  Anemia resolved with iron  replacement  -Can discontinue oral iron  at this time - Continue to monitor

## 2024-05-18 ENCOUNTER — Inpatient Hospital Stay (HOSPITAL_BASED_OUTPATIENT_CLINIC_OR_DEPARTMENT_OTHER): Admitting: Oncology

## 2024-05-18 VITALS — BP 139/45 | HR 64 | Temp 97.5°F | Resp 18 | Wt 162.5 lb

## 2024-05-18 DIAGNOSIS — C182 Malignant neoplasm of ascending colon: Secondary | ICD-10-CM

## 2024-05-18 DIAGNOSIS — D500 Iron deficiency anemia secondary to blood loss (chronic): Secondary | ICD-10-CM | POA: Diagnosis not present

## 2024-05-19 ENCOUNTER — Encounter: Payer: Self-pay | Admitting: Oncology

## 2024-05-22 LAB — SIGNATERA
SIGNATERA MTM READOUT: 0 MTM/ml
SIGNATERA TEST RESULT: NEGATIVE

## 2024-06-18 DIAGNOSIS — Z23 Encounter for immunization: Secondary | ICD-10-CM | POA: Diagnosis not present

## 2024-08-28 ENCOUNTER — Other Ambulatory Visit: Payer: Self-pay | Admitting: Internal Medicine

## 2024-08-28 DIAGNOSIS — K219 Gastro-esophageal reflux disease without esophagitis: Secondary | ICD-10-CM

## 2024-08-31 ENCOUNTER — Inpatient Hospital Stay: Attending: Oncology

## 2024-08-31 DIAGNOSIS — D500 Iron deficiency anemia secondary to blood loss (chronic): Secondary | ICD-10-CM | POA: Insufficient documentation

## 2024-08-31 DIAGNOSIS — C182 Malignant neoplasm of ascending colon: Secondary | ICD-10-CM | POA: Diagnosis present

## 2024-08-31 DIAGNOSIS — R911 Solitary pulmonary nodule: Secondary | ICD-10-CM | POA: Insufficient documentation

## 2024-08-31 LAB — COMPREHENSIVE METABOLIC PANEL WITH GFR
ALT: 20 U/L (ref 0–44)
AST: 25 U/L (ref 15–41)
Albumin: 4.3 g/dL (ref 3.5–5.0)
Alkaline Phosphatase: 98 U/L (ref 38–126)
Anion gap: 5 (ref 5–15)
BUN: 18 mg/dL (ref 8–23)
CO2: 33 mmol/L — ABNORMAL HIGH (ref 22–32)
Calcium: 9.3 mg/dL (ref 8.9–10.3)
Chloride: 100 mmol/L (ref 98–111)
Creatinine, Ser: 0.79 mg/dL (ref 0.44–1.00)
GFR, Estimated: >60 mL/min (ref >60)
Glucose, Bld: 83 mg/dL (ref 70–99)
Potassium: 4.1 mmol/L (ref 3.5–5.1)
Sodium: 138 mmol/L (ref 135–145)
Total Bilirubin: 0.3 mg/dL (ref 0.0–1.2)
Total Protein: 7 g/dL (ref 6.5–8.1)

## 2024-08-31 LAB — CBC WITH DIFFERENTIAL/PLATELET
Abs Immature Granulocytes: 0.01 K/uL (ref 0.00–0.07)
Basophils Absolute: 0 K/uL (ref 0.0–0.1)
Basophils Relative: 1 %
Eosinophils Absolute: 0.3 K/uL (ref 0.0–0.5)
Eosinophils Relative: 5 %
HCT: 38.3 % (ref 36.0–46.0)
Hemoglobin: 12.7 g/dL (ref 12.0–15.0)
Immature Granulocytes: 0 %
Lymphocytes Relative: 33 %
Lymphs Abs: 2.3 K/uL (ref 0.7–4.0)
MCH: 30.5 pg (ref 26.0–34.0)
MCHC: 33.2 g/dL (ref 30.0–36.0)
MCV: 92.1 fL (ref 80.0–100.0)
Monocytes Absolute: 0.6 K/uL (ref 0.1–1.0)
Monocytes Relative: 8 %
Neutro Abs: 3.8 K/uL (ref 1.7–7.7)
Neutrophils Relative %: 53 %
Platelets: 238 K/uL (ref 150–400)
RBC: 4.16 MIL/uL (ref 3.87–5.11)
RDW: 11.8 % (ref 11.5–15.5)
WBC: 7 K/uL (ref 4.0–10.5)
nRBC: 0 % (ref 0.0–0.2)

## 2024-09-01 LAB — CEA: CEA: 2.9 ng/mL (ref 0.0–4.7)

## 2024-09-05 ENCOUNTER — Encounter: Payer: Self-pay | Admitting: Internal Medicine

## 2024-09-05 ENCOUNTER — Ambulatory Visit: Admitting: Internal Medicine

## 2024-09-05 VITALS — BP 152/64 | HR 69 | Ht 69.0 in | Wt 173.0 lb

## 2024-09-05 DIAGNOSIS — K219 Gastro-esophageal reflux disease without esophagitis: Secondary | ICD-10-CM | POA: Diagnosis not present

## 2024-09-05 DIAGNOSIS — I10 Essential (primary) hypertension: Secondary | ICD-10-CM | POA: Diagnosis not present

## 2024-09-05 DIAGNOSIS — C182 Malignant neoplasm of ascending colon: Secondary | ICD-10-CM | POA: Diagnosis not present

## 2024-09-05 DIAGNOSIS — R911 Solitary pulmonary nodule: Secondary | ICD-10-CM | POA: Diagnosis not present

## 2024-09-05 MED ORDER — AMLODIPINE BESYLATE 5 MG PO TABS: 5.0000 mg | ORAL_TABLET | Freq: Every day | ORAL | 1 refills | Status: AC

## 2024-09-05 NOTE — Assessment & Plan Note (Addendum)
 S/p hemicolectomy in 03/25 Followed by oncology - gets surveillance CT chest, abdomen and pelvis

## 2024-09-05 NOTE — Patient Instructions (Signed)
 Please start taking Amlodipine as prescribed. Please continue taking Valsartan  as prescribed.  Please continue to take medications as prescribed.  Please continue to follow low salt diet and ambulate as tolerated.

## 2024-09-05 NOTE — Assessment & Plan Note (Addendum)
 Gets surveillance CT chest, used to be followed by CT surgery at [facility], will be followed by oncology now Last CT chest showed 11 mm pulmonary nodule in right upper lobe, which is overall stable from previous CT chest Currently asymptomatic

## 2024-09-05 NOTE — Assessment & Plan Note (Signed)
 BP Readings from Last 1 Encounters:  09/05/24 (!) 152/64   Uncontrolled with valsartan  160 mg QD Used to take HCTZ as well, but was discontinued due to concern for dehydration by Dr Melvenia- would avoid diuretic for now Added amlodipine 5 mg QD Counseled for compliance with the medications Advised DASH diet and ambulate as tolerated

## 2024-09-05 NOTE — Assessment & Plan Note (Signed)
 Well controlled with Pepcid 20 mg QD

## 2024-09-05 NOTE — Progress Notes (Signed)
 Established Patient Office Visit  Subjective:  Patient ID: Kayla [name], female    DOB: 07-02-1934  Age: 89 y.o. MRN: [id number]  CC:  Chief Complaint  Patient presents with   Follow-up    6 month f/u     HPI Kayla [name] is a 89 y.o. female with past medical history of HTN, GERD, colon carcinoma and HLD who presents for f/u of her chronic medical conditions.  HTN: Her BP was elevated today.  She is currently taking valsartan  160 mg QD.  She used to take combination valsartan -HCTZ, but HCTZ was discontinued due to concern for dehydration.  She denies any headache, dizziness, chest pain, or palpitations.  She takes Pepcid  20 mg QD for GERD.  Denies dysphagia, nausea or vomiting.    Past Medical History:  Diagnosis Date   Allergy    Anemia    gets iron  infusions.   Arthritis    Cancer (HCC)    colon cancer,  skin cancer on left cheek removed   Cataract    GERD (gastroesophageal reflux disease)    Heart murmur    Hypertension    Skin cancer     Past Surgical History:  Procedure Laterality Date   BELPHAROPTOSIS REPAIR Bilateral    BIOPSY  11/21/2023   Procedure: BIOPSY;  Surgeon: Cinderella Deatrice FALCON, MD;  [facility];  Service: Endoscopy;;   COLONOSCOPY WITH PROPOFOL  N/A 11/21/2023   Procedure: COLONOSCOPY WITH PROPOFOL ;  Surgeon: Cinderella Deatrice FALCON, MD;  [facility];  Service: Endoscopy;  Laterality: N/A;  12:45 pm, asa 2   ESOPHAGOGASTRODUODENOSCOPY (EGD) WITH PROPOFOL  N/A 11/21/2023   Procedure: ESOPHAGOGASTRODUODENOSCOPY (EGD) WITH PROPOFOL ;  Surgeon: Cinderella Deatrice FALCON, MD;  [facility];  Service: Endoscopy;  Laterality: N/A;   EYE SURGERY     cataract extraction.   SUBMUCOSAL TATTOO INJECTION  11/21/2023   Procedure: SUBMUCOSAL TATTOO INJECTION;  Surgeon: Cinderella Deatrice FALCON, MD;  [facility];  Service: Endoscopy;;   TONSILLECTOMY     age 39    History reviewed. No pertinent family history.  Social History    Socioeconomic History   Marital status: Widowed    Spouse name: Not on file   Number of children: Not on file   Years of education: Not on file   Highest education level: 12th grade  Occupational History   Not on file  Tobacco Use   Smoking status: Never   Smokeless tobacco: Never  Vaping Use   Vaping status: Never Used  Substance and Sexual Activity   Alcohol  use: Never   Drug use: Never   Sexual activity: Not Currently    Birth control/protection: None  Other Topics Concern   Not on file  Social History Narrative   Not on file   Social Drivers of Health   Financial Resource Strain: Low Risk  (09/01/2024)   Overall Financial Resource Strain (CARDIA)    Difficulty of Paying Living Expenses: Not hard at all  Food Insecurity: No Food Insecurity (09/01/2024)   Hunger Vital Sign    Worried About Running Out of Food in the Last Year: Never true    Ran Out of Food in the Last Year: Never true  Transportation Needs: No Transportation Needs (09/01/2024)   PRAPARE - Administrator, Civil Service (Medical): No    Lack of Transportation (Non-Medical): No  Physical Activity: Unknown (09/01/2024)   Exercise Vital Sign    Days of Exercise per Week:  7 days    Minutes of Exercise per Session: Patient declined  Stress: No Stress Concern Present (09/01/2024)   [employer] of Occupational Health - Occupational Stress Questionnaire    Feeling of Stress: Not at all  Social Connections: Moderately Integrated (09/01/2024)   Social Connection and Isolation Panel    Frequency of Communication with Friends and Family: More than three times a week    Frequency of Social Gatherings with Friends and Family: More than three times a week    Attends Religious Services: More than 4 times per year    Active Member of [employer] or Organizations: Yes    Attends Banker Meetings: More than 4 times per year    Marital Status: Widowed  Intimate Partner Violence: Not At Risk  (12/18/2023)   Humiliation, Afraid, Rape, and Kick questionnaire    Fear of Current or Ex-Partner: No    Emotionally Abused: No    Physically Abused: No    Sexually Abused: No    Outpatient Medications Prior to Visit  Medication Sig Dispense Refill   aspirin EC 81 MG tablet Take 81 mg by mouth daily.     famotidine  (PEPCID ) 20 MG tablet TAKE 1 TABLET BY MOUTH TWICE  DAILY (Patient taking differently: Take 20 mg by mouth daily.) 180 tablet 3   Fish Oil-Cholecalciferol (FISH OIL + D3) 1000-1000 MG-UNIT CAPS Take 2 capsules by mouth daily.     Multiple Vitamin (MULTIVITAMIN WITH MINERALS) TABS tablet Take 1 tablet by mouth daily.     valsartan  (DIOVAN ) 160 MG tablet TAKE 1 TABLET BY MOUTH DAILY 90 tablet 3   Iron , Ferrous Sulfate , 325 (65 Fe) MG TABS Take 325 mg by mouth daily. (Patient taking differently: Take 325 mg by mouth every other day.) 30 tablet 3   No facility-administered medications prior to visit.    No Known Allergies  ROS Review of Systems  Constitutional:  Negative for chills and fever.  HENT:  Negative for congestion, sinus pressure, sinus pain and sore throat.   Eyes:  Negative for pain and discharge.  Respiratory:  Negative for cough and shortness of breath.   Cardiovascular:  Negative for chest pain and palpitations.  Gastrointestinal:  Negative for abdominal pain, diarrhea, nausea and vomiting.  Endocrine: Negative for polydipsia and polyuria.  Genitourinary:  Negative for dysuria and hematuria.  Musculoskeletal:  Negative for neck pain and neck stiffness.  Skin:  Negative for rash.  Neurological:  Negative for dizziness and weakness.  Psychiatric/Behavioral:  Negative for agitation and behavioral problems.       Objective:    Physical Exam Vitals reviewed.  Constitutional:      General: She is not in acute distress.    Appearance: She is not diaphoretic.  HENT:     Head: Normocephalic and atraumatic.     Nose: Nose normal. No congestion.      Mouth/Throat:     Mouth: Mucous membranes are moist.     Pharynx: No posterior oropharyngeal erythema.  Eyes:     General: No scleral icterus.    Extraocular Movements: Extraocular movements intact.  Cardiovascular:     Rate and Rhythm: Normal rate and regular rhythm.     Heart sounds: Normal heart sounds. No murmur heard. Pulmonary:     Breath sounds: Normal breath sounds. No wheezing or rales.  Musculoskeletal:     Cervical back: Neck supple. No tenderness.     Right lower leg: No edema.     Left lower  leg: No edema.  Skin:    General: Skin is warm.     Findings: No rash.  Neurological:     General: No focal deficit present.     Mental Status: She is alert and oriented to person, place, and time.  Psychiatric:        Mood and Affect: Mood normal.        Behavior: Behavior normal.     BP (!) 152/64 (BP Location: Left Arm)   Pulse 69   Ht 5' 9 (1.753 m)   Wt 173 lb (78.5 kg)   SpO2 97%   BMI 25.55 kg/m  Wt Readings from Last 3 Encounters:  09/05/24 173 lb (78.5 kg)  05/18/24 162 lb 8 oz (73.7 kg)  04/04/24 159 lb (72.1 kg)    Lab Results  Component Value Date   TSH 2.200 10/31/2023   Lab Results  Component Value Date   WBC 7.0 08/31/2024   HGB 12.7 08/31/2024   HCT 38.3 08/31/2024   MCV 92.1 08/31/2024   PLT 238 08/31/2024   Lab Results  Component Value Date   NA 138 08/31/2024   K 4.1 08/31/2024   CO2 33 (H) 08/31/2024   GLUCOSE 83 08/31/2024   BUN 18 08/31/2024   CREATININE 0.79 08/31/2024   BILITOT 0.3 08/31/2024   ALKPHOS 98 08/31/2024   AST 25 08/31/2024   ALT 20 08/31/2024   PROT 7.0 08/31/2024   ALBUMIN 4.3 08/31/2024   CALCIUM 9.3 08/31/2024   ANIONGAP 5 08/31/2024   EGFR 67 10/31/2023   Lab Results  Component Value Date   CHOL 202 (H) 10/31/2023   Lab Results  Component Value Date   HDL 45 10/31/2023   Lab Results  Component Value Date   LDLCALC 136 (H) 10/31/2023   Lab Results  Component Value Date   TRIG 114 10/31/2023    Lab Results  Component Value Date   CHOLHDL 4.5 (H) 10/31/2023   Lab Results  Component Value Date   HGBA1C 5.6 10/18/2022      Assessment & Plan:   Problem List Items Addressed This Visit       Cardiovascular and Mediastinum   Hypertension - Primary   BP Readings from Last 1 Encounters:  09/05/24 (!) 152/64   Uncontrolled with valsartan  160 mg QD Used to take HCTZ as well, but was discontinued due to concern for dehydration by Dr Melvenia- would avoid diuretic for now Added amlodipine 5 mg QD Counseled for compliance with the medications Advised DASH diet and ambulate as tolerated      Relevant Medications   amLODipine (NORVASC) 5 MG tablet     Digestive   GERD (gastroesophageal reflux disease)   Well-controlled with Pepcid  20 mg QD      Malignant neoplasm of ascending colon (HCC)   S/p hemicolectomy in 03/25 Followed by oncology - gets surveillance CT chest, abdomen and pelvis        Other   Lung nodule, solitary   Gets surveillance CT chest, used to be followed by CT surgery at [facility], will be followed by oncology now Last CT chest showed 11 mm pulmonary nodule in right upper lobe, which is overall stable from previous CT chest Currently asymptomatic       Meds ordered this encounter  Medications   amLODipine (NORVASC) 5 MG tablet    Sig: Take 1 tablet (5 mg total) by mouth daily.    Dispense:  90 tablet    Refill:  1    Follow-up: Return in about 4 months (around 01/04/2025) for Annual physical.    Suzzane MARLA Blanch, MD

## 2024-09-07 ENCOUNTER — Inpatient Hospital Stay: Admitting: Oncology

## 2024-09-07 LAB — SIGNATERA
SIGNATERA MTM READOUT: 0 MTM/ml
SIGNATERA TEST RESULT: NEGATIVE

## 2024-09-13 ENCOUNTER — Inpatient Hospital Stay: Admitting: Oncology

## 2024-09-13 ENCOUNTER — Encounter: Payer: Self-pay | Admitting: Oncology

## 2024-09-13 VITALS — BP 124/52 | HR 77 | Temp 97.6°F | Resp 18 | Ht 69.0 in | Wt 174.0 lb

## 2024-09-13 DIAGNOSIS — R911 Solitary pulmonary nodule: Secondary | ICD-10-CM | POA: Diagnosis not present

## 2024-09-13 DIAGNOSIS — D500 Iron deficiency anemia secondary to blood loss (chronic): Secondary | ICD-10-CM | POA: Diagnosis not present

## 2024-09-13 DIAGNOSIS — C182 Malignant neoplasm of ascending colon: Secondary | ICD-10-CM | POA: Diagnosis not present

## 2024-09-13 NOTE — Progress Notes (Signed)
 Patient Care Team: Kayla Suzzane POUR, MD as PCP - General (Internal Medicine) Kayla Siad, MD as Medical Oncologist (Medical Oncology) Kayla Joesph SQUIBB, RN as Oncology Nurse Navigator (Medical Oncology)  Clinic Day:  09/13/2024  Referring physician: [name], MD   CHIEF COMPLAINT:  CC: Stage IIc colon carcinoma   ASSESSMENT & PLAN:   Assessment & Plan: Kayla [name]  is a 89 y.o. female with stage IIc colon carcinoma  Assessment & Plan  Malignant neoplasm of ascending colon (HCC) Stage IIc right-sided colon cancer s/p hemicolectomy.  Low risk. MLH1, PMS2: Loss of nuclear expression.  MSI-H. BRAF: positive No positive margins, no lymph node involvement. CT CAP: No sites of metastasis CT DNA: 03/02/24: Negative. 05/11/24: Negative   - Will proceed with surveillance as per patient's preference - Surveillance as per NCCN guidelines: -H&E every 3 to 6 months for 2 years, then every 6 months for a total of 5 years -CEA every 3 to 6 months for 2 years, then every 6 months for a total of 5 years -CT CAP every 6 to 12 months from the date of surgery for a total of 5 years -Colonoscopy in 1 year after surgery -Labs reviewed today.  CMP, CBC, CEA: WNL - CEA from 08/31/2024: Normal 2.9.  Repeat every 3 months.   - Patient wishes to do CT scan every 12 months.  Due 11/2024 - Due for colonoscopy in 11/2024 - Patient does not wish to continue signatera monitoring   Return to clinic in 3 months with labs and CT scan  Iron  deficiency anemia due to chronic blood loss Resolved at this time  Lung nodule Patient had a stable lung nodule for several years and wants CT monitoring with us   - Will add CT chest along with [facility] in March.    The patient understands the plans discussed today and is in agreement with them.  She knows to contact our office if she develops concerns prior to her next appointment.  I provided 17 minutes of face-to-face time during this encounter and  > 50% was spent counseling as documented under my assessment and plan.    Kayla [name],acting as a neurosurgeon for [name] [name], MD.,have documented all relevant documentation on the behalf of [name] [name], MD,as directed by  [name] [name], MD while in the presence of [name] [name], MD.  I, [name] [name] MD, have reviewed the above documentation for accuracy and completeness, and I agree with the above.     [name] [name], MD  [name] [address] [place] [address] Dept: [phone] Dept Fax: [phone]     Orders Placed This Encounter  Procedures   CT CHEST ABDOMEN PELVIS W CONTRAST    Standing Status:   Future    Expected Date:   12/12/2024    Expiration Date:   09/13/2025    If indicated for the ordered procedure, I authorize the administration of contrast media per Radiology protocol:   Yes    Does the patient have a contrast media/X-ray dye allergy?:   No    Preferred imaging location?:   [name]    If indicated for the ordered procedure, I authorize the administration of oral contrast media per Radiology protocol:   Yes   CBC with Differential    Standing Status:   Future    Expected Date:   12/12/2024  Expiration Date:   03/12/2025   Comprehensive metabolic panel    Standing Status:   Future    Expected Date:   12/12/2024    Expiration Date:   03/12/2025   CEA    Standing Status:   Future    Expected Date:   12/12/2024    Expiration Date:   03/12/2025     ONCOLOGY HISTORY:   Diagnosis: Ascending Colon Cancer  -11/11/2023: CT [facility]: Large irregular mass lesion in the ascending colon as described consistent with colonic neoplasm till proven otherwise. Direct visualization is recommended. -11/21/2023: Colonoscopy found a fungating and ulcerated partially obstructing large mass in the ascending colon. Pathology: Poorly differentiated adenocarcinoma  with signet ring cells. Loss of nuclear expression in MLH1 and PMS2. (Sporadic)  - BRAF V600E: mutation detected in BRAF Exon 15;p.V600E -11/29/2023: CT CAP: Short segment irregular wall thickening of the ascending colon with adjacent lobular fat stranding, compatible with known primary colonic neoplasm. Enlarged ileocolic lymph nodes measure up to 11 mm in short axis, compatible for nodal disease involvement. Prominent lymph nodes along the mesenteric root and retroperitoneum are nonspecific and may be reactive or reflect nodal disease involvement. No evidence of metastatic disease in the chest. Stable 11 mm ground-glass pulmonary nodule in the right upper lobe, suggest continued attention on follow-up imaging. -12/05/2023: CEA 9.3 -12/16/2023: Right hemicolectomy.  Pathology: Invasive poorly differentiated adenocarcinoma, 14 cm, circumferentially involving ascending colon and ileocecal valve. Carcinoma invades into subserosa of the adherent abdominal wall). Tumor is adherent to transverse colon but does not show definite evidence of invasion into the transverse colon wall. Resection margins are negative for carcinoma. Negative for lymphovascular or perineural invasion. Twenty-eight benign lymph nodes, negative for carcinoma (0/28). Staging: pT4b, pN0  -01/27/2024: CEA 2.3 - Patient preferred observation with no chemotherapy.  -03/02/2024 - current: Signatera negative   Current Treatment: Surveillance  INTERVAL HISTORY:   Kayla [name] is here today for follow up.    We dicussed lab results today, which were all normal. She has no complaints today. She denies any melena or dark colored stools.   Kayla [name] would like to have imaging done to follow-up on a lung nodule first seen around 5 years ago.   I have reviewed the past medical history, past surgical history, social history and family history with the patient and they are unchanged from previous note.  ALLERGIES:  has no known  allergies.  MEDICATIONS:  Current Outpatient Medications  Medication Sig Dispense Refill   amLODipine  (NORVASC ) 5 MG tablet Take 1 tablet (5 mg total) by mouth daily. 90 tablet 1   aspirin EC 81 MG tablet Take 81 mg by mouth daily.     famotidine  (PEPCID ) 20 MG tablet TAKE 1 TABLET BY MOUTH TWICE  DAILY (Patient taking differently: Take 20 mg by mouth daily.) 180 tablet 3   Fish Oil-Cholecalciferol (FISH OIL + D3) 1000-1000 MG-UNIT CAPS Take 2 capsules by mouth daily.     Iron , Ferrous Sulfate , 325 (65 Fe) MG TABS Take 325 mg by mouth daily. (Patient taking differently: Take 325 mg by mouth every other day.) 30 tablet 3   Multiple Vitamin (MULTIVITAMIN WITH MINERALS) TABS tablet Take 1 tablet by mouth daily.     valsartan  (DIOVAN ) 160 MG tablet TAKE 1 TABLET BY MOUTH DAILY 90 tablet 3   No current facility-administered medications for this visit.    VITALS:  Blood pressure (!) 124/52, pulse 77, temperature 97.6 F (36.4 C), temperature source Tympanic, resp. rate 18,  height 5' 9 (1.753 m), weight 174 lb (78.9 kg), SpO2 99%.  Wt Readings from Last 3 Encounters:  09/13/24 174 lb (78.9 kg)  09/05/24 173 lb (78.5 kg)  05/18/24 162 lb 8 oz (73.7 kg)    Body mass index is 25.7 kg/m.  Performance status (ECOG): 0 - Asymptomatic  PHYSICAL EXAM:   GENERAL:alert, no distress and comfortable SKIN: skin color, texture, turgor are normal, no rashes or significant lesions LUNGS: clear to auscultation and percussion with normal breathing effort HEART: regular rate & rhythm and no murmurs and no lower extremity edema ABDOMEN:abdomen soft, non-tender and normal bowel sounds. Musculoskeletal:no cyanosis of digits and no clubbing  NEURO: alert & oriented x 3 with fluent speech  LABORATORY DATA:  I have reviewed the data as listed    Component Value Date/Time   NA 138 08/31/2024 1047   NA 140 10/31/2023 0942   K 4.1 08/31/2024 1047   CL 100 08/31/2024 1047   CO2 33 (H) 08/31/2024 1047    GLUCOSE 83 08/31/2024 1047   BUN 18 08/31/2024 1047   BUN 18 10/31/2023 0942   CREATININE 0.79 08/31/2024 1047   CALCIUM 9.3 08/31/2024 1047   PROT 7.0 08/31/2024 1047   PROT 6.0 10/31/2023 0942   ALBUMIN 4.3 08/31/2024 1047   ALBUMIN 3.7 10/31/2023 0942   AST 25 08/31/2024 1047   ALT 20 08/31/2024 1047   ALKPHOS 98 08/31/2024 1047   BILITOT 0.3 08/31/2024 1047   BILITOT <0.2 10/31/2023 0942   GFRNONAA >60 08/31/2024 1047   GFRAA >60 08/02/2019 0309    Lab Results  Component Value Date   WBC 7.0 08/31/2024   NEUTROABS 3.8 08/31/2024   HGB 12.7 08/31/2024   HCT 38.3 08/31/2024   MCV 92.1 08/31/2024   PLT 238 08/31/2024      Chemistry      Component Value Date/Time   NA 138 08/31/2024 1047   NA 140 10/31/2023 0942   K 4.1 08/31/2024 1047   CL 100 08/31/2024 1047   CO2 33 (H) 08/31/2024 1047   BUN 18 08/31/2024 1047   BUN 18 10/31/2023 0942   CREATININE 0.79 08/31/2024 1047      Component Value Date/Time   CALCIUM 9.3 08/31/2024 1047   ALKPHOS 98 08/31/2024 1047   AST 25 08/31/2024 1047   ALT 20 08/31/2024 1047   BILITOT 0.3 08/31/2024 1047   BILITOT <0.2 10/31/2023 0942      Latest Reference Range & Units 08/31/24 10:47  CEA 0.0 - 4.7 ng/mL 2.9

## 2024-09-13 NOTE — Patient Instructions (Signed)
 [facility] at [facility] Discharge Instructions   You were seen and examined today by Dr. [name].  She reviewed the results of your lab work which are normal/stable.   We will see you back in 3 months. We will repeat lab work and a CT scan prior to this visit.    Return as scheduled.    Thank you for choosing [facility] at [facility] to provide your oncology and hematology care.  To afford each patient quality time with our provider, please arrive at least 15 minutes before your scheduled appointment time.   If you have a lab appointment with the Cancer Center please come in thru the Main Entrance and check in at the main information desk.  You need to re-schedule your appointment should you arrive 10 or more minutes late.  We strive to give you quality time with our providers, and arriving late affects you and other patients whose appointments are after yours.  Also, if you no show three or more times for appointments you may be dismissed from the clinic at the providers discretion.     Again, thank you for choosing [facility].  Our hope is that these requests will decrease the amount of time that you wait before being seen by our physicians.       _____________________________________________________________  Should you have questions after your visit to [facility], please contact our office at [phone] and follow the prompts.  Our office hours are 8:00 a.m. and 4:30 p.m. Monday - Friday.  Please note that voicemails left after 4:00 p.m. may not be returned until the following business day.  We are closed weekends and major holidays.  You do have access to a nurse 24-7, just call the main number to the clinic [phone] and do not press any options, hold on the line and a nurse will answer the phone.    For prescription refill requests, have your pharmacy contact our office and allow 72 hours.    Due to  Covid, you will need to wear a mask upon entering the hospital. If you do not have a mask, a mask will be given to you at the Main Entrance upon arrival. For doctor visits, patients may have 1 support person age 40 or older with them. For treatment visits, patients can not have anyone with them due to social distancing guidelines and our immunocompromised population.

## 2024-09-24 ENCOUNTER — Encounter: Payer: Self-pay | Admitting: *Deleted

## 2024-11-14 ENCOUNTER — Ambulatory Visit: Payer: Medicare Other

## 2024-12-03 ENCOUNTER — Inpatient Hospital Stay: Attending: Oncology

## 2024-12-03 ENCOUNTER — Other Ambulatory Visit (HOSPITAL_COMMUNITY)

## 2024-12-13 ENCOUNTER — Inpatient Hospital Stay: Admitting: Oncology

## 2025-01-01 ENCOUNTER — Encounter: Admitting: Internal Medicine
